# Patient Record
Sex: Male | Born: 1950 | Race: White | Hispanic: Yes | Marital: Married | Smoking: Former smoker
Health system: Southern US, Community
[De-identification: ages and names within clinical notes are randomized; demographics above are authoritative.]

## PROBLEM LIST (undated history)

## (undated) DIAGNOSIS — R002 Palpitations: Secondary | ICD-10-CM

## (undated) DIAGNOSIS — E785 Hyperlipidemia, unspecified: Secondary | ICD-10-CM

## (undated) DIAGNOSIS — E781 Pure hyperglyceridemia: Secondary | ICD-10-CM

## (undated) DIAGNOSIS — H8109 Meniere's disease, unspecified ear: Secondary | ICD-10-CM

## (undated) DIAGNOSIS — E7800 Pure hypercholesterolemia, unspecified: Secondary | ICD-10-CM

## (undated) DIAGNOSIS — N189 Chronic kidney disease, unspecified: Secondary | ICD-10-CM

## (undated) DIAGNOSIS — I10 Essential (primary) hypertension: Secondary | ICD-10-CM

## (undated) DIAGNOSIS — I6523 Occlusion and stenosis of bilateral carotid arteries: Secondary | ICD-10-CM

## (undated) DIAGNOSIS — N320 Bladder-neck obstruction: Secondary | ICD-10-CM

## (undated) DIAGNOSIS — R7303 Prediabetes: Secondary | ICD-10-CM

## (undated) DIAGNOSIS — M129 Arthropathy, unspecified: Secondary | ICD-10-CM

## (undated) DIAGNOSIS — G4733 Obstructive sleep apnea (adult) (pediatric): Secondary | ICD-10-CM

## (undated) DIAGNOSIS — R1313 Dysphagia, pharyngeal phase: Secondary | ICD-10-CM

## (undated) DIAGNOSIS — C801 Malignant (primary) neoplasm, unspecified: Secondary | ICD-10-CM

## (undated) DIAGNOSIS — N529 Male erectile dysfunction, unspecified: Secondary | ICD-10-CM

## (undated) DIAGNOSIS — R42 Dizziness and giddiness: Secondary | ICD-10-CM

## (undated) DIAGNOSIS — K589 Irritable bowel syndrome without diarrhea: Secondary | ICD-10-CM

## (undated) DIAGNOSIS — R809 Proteinuria, unspecified: Secondary | ICD-10-CM

## (undated) DIAGNOSIS — K219 Gastro-esophageal reflux disease without esophagitis: Secondary | ICD-10-CM

## (undated) DIAGNOSIS — I209 Angina pectoris, unspecified: Secondary | ICD-10-CM

## (undated) DIAGNOSIS — E559 Vitamin D deficiency, unspecified: Secondary | ICD-10-CM

## (undated) DIAGNOSIS — R972 Elevated prostate specific antigen [PSA]: Secondary | ICD-10-CM

## (undated) DIAGNOSIS — R822 Biliuria: Secondary | ICD-10-CM

## (undated) DIAGNOSIS — J45909 Unspecified asthma, uncomplicated: Secondary | ICD-10-CM

## (undated) DIAGNOSIS — T8859XA Other complications of anesthesia, initial encounter: Secondary | ICD-10-CM

## (undated) DIAGNOSIS — K439 Ventral hernia without obstruction or gangrene: Secondary | ICD-10-CM

## (undated) DIAGNOSIS — R5383 Other fatigue: Secondary | ICD-10-CM

## (undated) DIAGNOSIS — R824 Acetonuria: Secondary | ICD-10-CM

## (undated) DIAGNOSIS — R3129 Other microscopic hematuria: Secondary | ICD-10-CM

## (undated) DIAGNOSIS — S82891A Other fracture of right lower leg, initial encounter for closed fracture: Secondary | ICD-10-CM

## (undated) DIAGNOSIS — R82998 Other abnormal findings in urine: Secondary | ICD-10-CM

## (undated) DIAGNOSIS — R300 Dysuria: Secondary | ICD-10-CM

## (undated) DIAGNOSIS — R399 Unspecified symptoms and signs involving the genitourinary system: Secondary | ICD-10-CM

## (undated) HISTORY — DX: Other fracture of right lower leg, initial encounter for closed fracture: S82.891A

## (undated) HISTORY — PX: FLEXIBLE BRONCHOSCOPY W/ UPPER ENDOSCOPY: SHX1648

## (undated) HISTORY — PX: PROSTATE BIOPSY: SHX241

## (undated) HISTORY — PX: COLONOSCOPY: SHX174

---

## 2005-04-09 ENCOUNTER — Ambulatory Visit: Payer: Self-pay | Admitting: Internal Medicine

## 2005-05-23 ENCOUNTER — Ambulatory Visit (HOSPITAL_COMMUNITY): Admission: RE | Admit: 2005-05-23 | Discharge: 2005-05-23 | Payer: Self-pay | Admitting: Internal Medicine

## 2007-06-03 ENCOUNTER — Emergency Department (HOSPITAL_COMMUNITY): Admission: EM | Admit: 2007-06-03 | Discharge: 2007-06-03 | Payer: Self-pay | Admitting: Emergency Medicine

## 2007-06-04 ENCOUNTER — Encounter (INDEPENDENT_AMBULATORY_CARE_PROVIDER_SITE_OTHER): Payer: Self-pay | Admitting: Internal Medicine

## 2007-06-04 ENCOUNTER — Ambulatory Visit: Payer: Self-pay | Admitting: Family Medicine

## 2007-06-04 LAB — CONVERTED CEMR LAB
AST: 25 units/L (ref 0–37)
Albumin: 4.4 g/dL (ref 3.5–5.2)
BUN: 16 mg/dL (ref 6–23)
Basophils Relative: 0 % (ref 0–1)
Calcium: 8.4 mg/dL (ref 8.4–10.5)
Chloride: 105 meq/L (ref 96–112)
Creatinine, Ser: 1.09 mg/dL (ref 0.40–1.50)
Glucose, Bld: 80 mg/dL (ref 70–99)
Hemoglobin: 15 g/dL (ref 13.0–17.0)
Lymphs Abs: 1.7 10*3/uL (ref 0.7–4.0)
MCHC: 33.5 g/dL (ref 30.0–36.0)
MCV: 94.7 fL (ref 78.0–100.0)
Monocytes Absolute: 0.4 10*3/uL (ref 0.1–1.0)
Monocytes Relative: 8 % (ref 3–12)
Neutro Abs: 3.1 10*3/uL (ref 1.7–7.7)
PSA: 0.73 ng/mL (ref 0.10–4.00)
RBC: 4.73 M/uL (ref 4.22–5.81)
WBC: 5.3 10*3/uL (ref 4.0–10.5)

## 2007-06-09 ENCOUNTER — Ambulatory Visit (HOSPITAL_COMMUNITY): Admission: RE | Admit: 2007-06-09 | Discharge: 2007-06-09 | Payer: Self-pay | Admitting: Internal Medicine

## 2007-06-25 ENCOUNTER — Ambulatory Visit: Payer: Self-pay | Admitting: Internal Medicine

## 2008-01-11 ENCOUNTER — Emergency Department (HOSPITAL_COMMUNITY): Admission: EM | Admit: 2008-01-11 | Discharge: 2008-01-12 | Payer: Self-pay | Admitting: Emergency Medicine

## 2008-04-27 ENCOUNTER — Ambulatory Visit: Payer: Self-pay | Admitting: Internal Medicine

## 2008-05-26 ENCOUNTER — Ambulatory Visit (HOSPITAL_COMMUNITY): Admission: RE | Admit: 2008-05-26 | Discharge: 2008-05-26 | Payer: Self-pay | Admitting: Internal Medicine

## 2008-08-16 ENCOUNTER — Emergency Department (HOSPITAL_COMMUNITY): Admission: EM | Admit: 2008-08-16 | Discharge: 2008-08-16 | Payer: Self-pay | Admitting: Emergency Medicine

## 2009-01-11 ENCOUNTER — Ambulatory Visit: Payer: Self-pay | Admitting: Internal Medicine

## 2009-05-09 ENCOUNTER — Ambulatory Visit: Payer: Self-pay | Admitting: Internal Medicine

## 2009-08-09 ENCOUNTER — Ambulatory Visit: Payer: Self-pay | Admitting: Internal Medicine

## 2009-08-09 ENCOUNTER — Encounter (INDEPENDENT_AMBULATORY_CARE_PROVIDER_SITE_OTHER): Payer: Self-pay | Admitting: Family Medicine

## 2009-08-09 LAB — CONVERTED CEMR LAB
ALT: 28 units/L (ref 0–53)
AST: 23 units/L (ref 0–37)
CO2: 21 meq/L (ref 19–32)
Calcium: 9.3 mg/dL (ref 8.4–10.5)
Chloride: 103 meq/L (ref 96–112)
Cholesterol: 319 mg/dL — ABNORMAL HIGH (ref 0–200)
Creatinine, Ser: 1.32 mg/dL (ref 0.40–1.50)
Hemoglobin: 14.7 g/dL (ref 13.0–17.0)
Lymphocytes Relative: 40 % (ref 12–46)
Lymphs Abs: 1.8 10*3/uL (ref 0.7–4.0)
MCHC: 32.3 g/dL (ref 30.0–36.0)
Monocytes Absolute: 0.4 10*3/uL (ref 0.1–1.0)
Monocytes Relative: 10 % (ref 3–12)
Neutro Abs: 2 10*3/uL (ref 1.7–7.7)
Neutrophils Relative %: 47 % (ref 43–77)
PSA: 0.86 ng/mL (ref 0.10–4.00)
Potassium: 4.4 meq/L (ref 3.5–5.3)
RBC: 4.79 M/uL (ref 4.22–5.81)
Sed Rate: 20 mm/hr — ABNORMAL HIGH (ref 0–16)
Sodium: 138 meq/L (ref 135–145)
Total CHOL/HDL Ratio: 8.6
Total Protein: 7.9 g/dL (ref 6.0–8.3)
WBC: 4.4 10*3/uL (ref 4.0–10.5)

## 2009-08-29 ENCOUNTER — Ambulatory Visit: Payer: Self-pay | Admitting: Internal Medicine

## 2009-10-23 ENCOUNTER — Ambulatory Visit: Payer: Self-pay | Admitting: Internal Medicine

## 2009-10-23 LAB — CONVERTED CEMR LAB
ALT: 64 units/L — ABNORMAL HIGH (ref 0–53)
Albumin: 4.2 g/dL (ref 3.5–5.2)
Alkaline Phosphatase: 90 units/L (ref 39–117)
CO2: 21 meq/L (ref 19–32)
Cholesterol: 219 mg/dL — ABNORMAL HIGH (ref 0–200)
Glucose, Bld: 98 mg/dL (ref 70–99)
Potassium: 4.1 meq/L (ref 3.5–5.3)
Sodium: 137 meq/L (ref 135–145)
Total Bilirubin: 0.8 mg/dL (ref 0.3–1.2)
Total Protein: 7.1 g/dL (ref 6.0–8.3)

## 2009-11-07 ENCOUNTER — Ambulatory Visit: Payer: Self-pay | Admitting: Internal Medicine

## 2009-12-06 ENCOUNTER — Ambulatory Visit: Payer: Self-pay | Admitting: Internal Medicine

## 2009-12-06 ENCOUNTER — Encounter (INDEPENDENT_AMBULATORY_CARE_PROVIDER_SITE_OTHER): Payer: Self-pay | Admitting: Family Medicine

## 2009-12-06 LAB — CONVERTED CEMR LAB
HDL: 35 mg/dL — ABNORMAL LOW (ref >39)
Total CHOL/HDL Ratio: 4.9
Triglycerides: 261 mg/dL — ABNORMAL HIGH (ref <150)

## 2009-12-19 ENCOUNTER — Ambulatory Visit: Payer: Self-pay | Admitting: Internal Medicine

## 2010-02-20 ENCOUNTER — Ambulatory Visit: Payer: Self-pay | Admitting: Internal Medicine

## 2010-08-06 ENCOUNTER — Encounter (INDEPENDENT_AMBULATORY_CARE_PROVIDER_SITE_OTHER): Payer: Self-pay | Admitting: *Deleted

## 2010-08-06 LAB — CONVERTED CEMR LAB
BUN: 17 mg/dL (ref 6–23)
CO2: 28 meq/L (ref 19–32)
Calcium: 10 mg/dL (ref 8.4–10.5)
Chloride: 104 meq/L (ref 96–112)
Cholesterol: 350 mg/dL — ABNORMAL HIGH (ref 0–200)
Creatinine, Ser: 1.31 mg/dL (ref 0.40–1.50)
Glucose, Bld: 112 mg/dL — ABNORMAL HIGH (ref 70–99)
HDL: 43 mg/dL (ref >39)
Total Bilirubin: 1.4 mg/dL — ABNORMAL HIGH (ref 0.3–1.2)
Total CHOL/HDL Ratio: 8.1
Triglycerides: 448 mg/dL — ABNORMAL HIGH (ref <150)

## 2010-10-20 ENCOUNTER — Inpatient Hospital Stay (INDEPENDENT_AMBULATORY_CARE_PROVIDER_SITE_OTHER)
Admission: RE | Admit: 2010-10-20 | Discharge: 2010-10-20 | Disposition: A | Discharge status: Home / Self Care | Payer: Self-pay | Source: Ambulatory Visit | Attending: Family Medicine | Admitting: Family Medicine

## 2010-10-20 ENCOUNTER — Ambulatory Visit (INDEPENDENT_AMBULATORY_CARE_PROVIDER_SITE_OTHER): Payer: Self-pay

## 2010-10-20 DIAGNOSIS — S5000XA Contusion of unspecified elbow, initial encounter: Secondary | ICD-10-CM

## 2011-11-25 ENCOUNTER — Encounter (HOSPITAL_COMMUNITY): Payer: Self-pay | Admitting: Cardiology

## 2011-11-25 ENCOUNTER — Emergency Department (INDEPENDENT_AMBULATORY_CARE_PROVIDER_SITE_OTHER)
Admission: EM | Admit: 2011-11-25 | Discharge: 2011-11-25 | Disposition: A | Discharge status: Home / Self Care | Payer: Worker's Compensation | Source: Home / Self Care

## 2011-11-25 DIAGNOSIS — S79929A Unspecified injury of unspecified thigh, initial encounter: Secondary | ICD-10-CM

## 2011-11-25 DIAGNOSIS — S79919A Unspecified injury of unspecified hip, initial encounter: Secondary | ICD-10-CM

## 2011-11-25 DIAGNOSIS — S76909A Unspecified injury of unspecified muscles, fascia and tendons at thigh level, unspecified thigh, initial encounter: Secondary | ICD-10-CM

## 2011-11-25 HISTORY — DX: Pure hypercholesterolemia, unspecified: E78.00

## 2011-11-25 MED ORDER — MELOXICAM 15 MG PO TABS: 15.0000 mg | ORAL_TABLET | Freq: Every day | ORAL | Status: AC

## 2011-11-25 NOTE — ED Provider Notes (Signed)
Timothy Rice is a 61 y.o. male who presents to Urgent Care today for right thigh pain for 1 week. Patient slipped one week ago and twisted his right knee. He has pain on the distal medial thigh since his injury. He has tried ice, rest, compression and elevation and Tylenol without much improvement.  He denies any locking catching or giving way.  He can walk.  He denies any history of knee injury.  He feels well otherwise.  He never made contact with the ground when he slipped.       PMH reviewed. Otherwise healthy  History  Substance Use Topics  . Smoking status: Never Smoker   . Smokeless tobacco: Not on file  . Alcohol Use: Yes     occas   ROS as above Medications reviewed. No current facility-administered medications for this encounter.   Current Outpatient Prescriptions  Medication Sig Dispense Refill  . PRESCRIPTION MEDICATION Cholesterol medication daily      . meloxicam (MOBIC) 15 MG tablet Take 1 tablet (15 mg total) by mouth daily.  14 tablet  0    Exam:  BP 124/81  Pulse 78  Temp(Src) 98.1 F (36.7 C) (Oral)  Resp 18  SpO2 100% Gen: Well NAD  right leg: Normal thigh musculature.  Tender to palpation along the distal vastus medialis or sartorious muscle.  Nontender over the joint line.  Normal motion without crepitation. No effusion.  Negative Lachman's curies in normal valgus and varus stress.   Strength is intact to knee extension.  However when asked to apply pressure against his medial foot with the knee pain at 90 he experiences pain at the distal insertion site of the medial quadriceps muscles.   No results found for this or any previous visit (from the past 24 hour(s)). No results found.  Assessment and Plan: 61 y.o. male with VMO or sartorious muscle strain.  Plan to use RICE therapy with NSAIDS. Provided prescription for meloxicam. If no improvement in one to 2 weeks follow up with orthopedics. Provided a handout in [place]. Patient expresses  understanding.      Rodolph Bong, MD 11/25/11 1816

## 2011-11-25 NOTE — ED Notes (Signed)
Pt injuried right knee one week ago by twisting type injury. Pt states pain is 5/10. Has not improved over the past week.

## 2011-11-25 NOTE — Discharge Instructions (Signed)
Thank you for coming in today. You have a muscle injury to the thigh.  Please take meloxicam daily for 2 weeks as needed for pain.  It should slowly get better.  Make an appointment with Dr. Charlann Boxer if you do not get better in a week.   Lesiones Deportivas (Athletic Injuries) Un tratamiento adecuado y temprano y la rehabilitacin llevan a una recuperacin ms rpida de la mayor parte de las lesiones deportivas. Podr reanudar su prctica deportiva completamente recuperado en menos tiempo si sigue estas reglas generales:   Reposo.Debe dejar reposar la lesin hasta que el movimiento no le provoque dolor. Donnamae Jude articulacin o un msculo lesionado Building control surveyor.   Eleve la lesin.Mantenga la zona lesionada elevada hasta que casi no haya hinchazn ni dolor. Lo mejor es elevar la zona lesionada por encima del nivel del corazn, si es posible.   Hielo.Aplique bolsas de hielo directamente en la lesin durante 3 a 4 das.   Compresin.Use un vendaje elstico en su lesin segn le hayan indicado. Esto ayuda a Building services engineer. Los vendajes elsticos no protegen articulaciones lesionadas. No permita que le transmitan un falso sentido de seguridad. Para esto es mejor utilizar tablillas ms rgidas y [place].   Rehabilitacin. Debera comenzar ni bien hayan cedido la hinchazn y el dolor de su lesin. Incluye ejercicios para mejorar el movimiento de la articulacin y la fuerza muscular. En ocasiones se utilizan refuerzos especiales, tablillas u ortesis para proteger de otra lesin cuando retoma su actividad deportiva.  Mantener una actitud positiva ayudar a que cure sus heridas ms rpida y completamente. Podr volver a Engineer, drilling fsicas que no le causen dolor ni que aumenten el riesgo de una nueva lesin, o segn lo que le hayan indicado. Esto lo ayudar a mantenerse en buen estado fsico. Tambin mejorar su actitud mental. No use demasiado su extremidad lesionada. Esto har que  sienta molestias y Administrator, sports.  Document Released: 06/03/2005 Document Revised: 05/23/2011 [facility] Patient Information 2012 [place], [place].

## 2011-11-27 NOTE — ED Provider Notes (Signed)
Medical screening examination/treatment/procedure(s) were performed by PGY-3 FM resident and as supervising physician I was immediately available for consultation/collaboration.   Nadeem Romanoski Moreno-Coll, MD   Tawona Filsinger Moreno-Coll, MD 11/27/11 0733 

## 2012-05-01 ENCOUNTER — Encounter: Payer: Self-pay | Admitting: Family Medicine

## 2012-05-01 ENCOUNTER — Ambulatory Visit: Payer: Self-pay | Admitting: Family Medicine

## 2012-05-01 VITALS — BP 116/76 | HR 72 | Temp 98.0°F | Resp 16 | Ht 64.0 in | Wt 151.6 lb

## 2012-05-01 DIAGNOSIS — E789 Disorder of lipoprotein metabolism, unspecified: Secondary | ICD-10-CM

## 2012-05-01 DIAGNOSIS — R002 Palpitations: Secondary | ICD-10-CM

## 2012-05-01 DIAGNOSIS — E785 Hyperlipidemia, unspecified: Secondary | ICD-10-CM

## 2012-05-01 LAB — COMPREHENSIVE METABOLIC PANEL
BUN: 16 mg/dL (ref 6–23)
CO2: 29 mEq/L (ref 19–32)
Creat: 1.08 mg/dL (ref 0.50–1.35)
Glucose, Bld: 97 mg/dL (ref 70–99)
Sodium: 139 mEq/L (ref 135–145)
Total Bilirubin: 1.5 mg/dL — ABNORMAL HIGH (ref 0.3–1.2)
Total Protein: 7.4 g/dL (ref 6.0–8.3)

## 2012-05-01 LAB — POCT CBC
Granulocyte percent: 59.7 %G (ref 37–80)
Hemoglobin: 15 g/dL (ref 14.1–18.1)
Lymph, poc: 1.9 (ref 0.6–3.4)
MCHC: 31.3 g/dL — AB (ref 31.8–35.4)
MPV: 9.4 fL (ref 0–99.8)
POC Granulocyte: 3.4 (ref 2–6.9)
POC MID %: 7.3 %M (ref 0–12)
RDW, POC: 12.6 %

## 2012-05-01 LAB — LIPID PANEL
Cholesterol: 199 mg/dL (ref 0–200)
HDL: 40 mg/dL (ref >39)
Triglycerides: 264 mg/dL — ABNORMAL HIGH (ref <150)
VLDL: 53 mg/dL — ABNORMAL HIGH (ref 0–40)

## 2012-05-01 MED ORDER — ROSUVASTATIN CALCIUM 40 MG PO TABS: 40.0000 mg | ORAL_TABLET | Freq: Every day | ORAL | Status: DC

## 2012-05-01 NOTE — Patient Instructions (Signed)
Your should receive a call or letter about your lab results within the next week to 10 days.  If your heart racing worsens or any chest pain or other new/worsening symptoms - go to emergency room.  Recheck in next few weeks to discuss your labs and heart racing - you may need to see a cardiologist.  You can schedule a physical in the next few months. We will request your old records from [facility].

## 2012-05-01 NOTE — Progress Notes (Signed)
Subjective:    Patient ID: [name], male    DOB: January 10, 1951, 61 y.o.   MRN: [id number]  HPI Powell Halbert is a 61 y.o. male Prior patient of [facility]. New patient here. On unknown name of chol med - takes each night.  Occasional missed dose - about 2 or 3 times per month.  No new side effects.  On this med for 3 -4 years.  No known personal or FH of CAD/early CAD. No new myalgias.  Works in Web designer - Insurance account manager.   Occasional faster heartbeat. No chest pain. No dyspnea. Lasts about 30 seconds at times.  Once a week or month. No dizziness with these symptoms.   Review of Systems  Respiratory: Negative for cough, chest tightness and shortness of breath.   Cardiovascular: Positive for palpitations (intermittent as above. ). Negative for chest pain and leg swelling.  Musculoskeletal: Negative for myalgias.  Neurological: Positive for light-headedness (only a few times - with standing up after head leaning down.  no syncope, no dizziness with  palitations. ). Negative for dizziness, seizures, syncope and headaches.   As above.     Objective:   Physical Exam  Vitals reviewed. Constitutional: He is oriented to person, place, and time. He appears well-developed and well-nourished.  HENT:  Head: Normocephalic and atraumatic.  Eyes: EOM are normal. Pupils are equal, round, and reactive to light.  Neck: No JVD present. Carotid bruit is not present. No thyromegaly (no nodules palpated. ) present.  Cardiovascular: Normal rate, regular rhythm and normal heart sounds.   No murmur heard. Pulmonary/Chest: Effort normal and breath sounds normal. He has no rales.  Musculoskeletal: He exhibits no edema.  Neurological: He is alert and oriented to person, place, and time.  Skin: Skin is warm and dry.  Psychiatric: He has a normal mood and affect. His behavior is normal.   EKG: sr, no acute findings.   Results for orders placed in visit on 05/01/12  POCT CBC      Component Value  Range   WBC 5.7  4.6 - 10.2 K/uL   Lymph, poc 1.9  0.6 - 3.4   POC LYMPH PERCENT 33.0  10 - 50 %L   MID (cbc) 0.4  0 - 0.9   POC MID % 7.3  0 - 12 %M   POC Granulocyte 3.4  2 - 6.9   Granulocyte percent 59.7  37 - 80 %G   RBC 4.97  4.69 - 6.13 M/uL   Hemoglobin 15.0  14.1 - 18.1 g/dL   HCT, POC 14.7  82.9 - 53.7 %   MCV 96.6  80 - 97 fL   MCH, POC 30.2  27 - 31.2 pg   MCHC 31.3 (*) 31.8 - 35.4 g/dL   RDW, POC 56.2     Platelet Count, POC 227  142 - 424 K/uL   MPV 9.4  0 - 99.8 fL       Assessment & Plan:  Saafir Abdullah is a 61 y.o. male 1. Hyperlipidemia  Comprehensive metabolic panel, Lipid panel  2. Palpitations  TSH, POCT CBC, EKG 12-Lead   Hyperlipidemia - check labs above. By history on 40mg  Crestor each day.  Will refill this - check lipids. Follow up for physical in next few months - encouraged to schedule.   Palpitations - intermittent check labs above, but follow up to discuss next few weeks as may need cardiology eval. Rtc/er/911 precautions reviewed.   Encouraged to rtc if any worsening of symptoms  and to recheck episodic dizziness - not associated with palpitations at present, but if his occurs - to ER/911. Understanding expressed.  Spanish and english spoken.

## 2014-06-17 DIAGNOSIS — S82891A Other fracture of right lower leg, initial encounter for closed fracture: Secondary | ICD-10-CM

## 2014-06-17 HISTORY — DX: Other fracture of right lower leg, initial encounter for closed fracture: S82.891A

## 2014-12-16 ENCOUNTER — Ambulatory Visit (INDEPENDENT_AMBULATORY_CARE_PROVIDER_SITE_OTHER): Payer: 59 | Admitting: Emergency Medicine

## 2014-12-16 VITALS — BP 118/72 | HR 96 | Temp 97.9°F | Resp 18 | Ht 63.0 in | Wt 149.0 lb

## 2014-12-16 DIAGNOSIS — E785 Hyperlipidemia, unspecified: Secondary | ICD-10-CM | POA: Diagnosis not present

## 2014-12-16 DIAGNOSIS — Z0000 Encounter for general adult medical examination without abnormal findings: Secondary | ICD-10-CM

## 2014-12-16 DIAGNOSIS — R197 Diarrhea, unspecified: Secondary | ICD-10-CM

## 2014-12-16 LAB — LIPID PANEL
Cholesterol: 429 mg/dL — ABNORMAL HIGH (ref 0–200)
HDL: 39 mg/dL — ABNORMAL LOW (ref >=40)
Total CHOL/HDL Ratio: 11 Ratio
Triglycerides: 626 mg/dL — ABNORMAL HIGH (ref <150)

## 2014-12-16 LAB — COMPLETE METABOLIC PANEL WITH GFR
ALK PHOS: 80 U/L (ref 39–117)
ALT: 17 U/L (ref 0–53)
AST: 19 U/L (ref 0–37)
Albumin: 3.8 g/dL (ref 3.5–5.2)
BUN: 9 mg/dL (ref 6–23)
CALCIUM: 8.6 mg/dL (ref 8.4–10.5)
CHLORIDE: 104 meq/L (ref 96–112)
CO2: 26 meq/L (ref 19–32)
Creat: 1.2 mg/dL (ref 0.50–1.35)
GFR, EST AFRICAN AMERICAN: 74 mL/min
GFR, EST NON AFRICAN AMERICAN: 64 mL/min
GLUCOSE: 92 mg/dL (ref 70–99)
Potassium: 4.2 mEq/L (ref 3.5–5.3)
Sodium: 139 mEq/L (ref 135–145)
Total Bilirubin: 1.2 mg/dL (ref 0.2–1.2)
Total Protein: 6.5 g/dL (ref 6.0–8.3)

## 2014-12-16 LAB — POCT CBC
GRANULOCYTE PERCENT: 57.2 % (ref 37–80)
HEMATOCRIT: 41.4 % — AB (ref 43.5–53.7)
Hemoglobin: 13.7 g/dL — AB (ref 14.1–18.1)
Lymph, poc: 1.4 (ref 0.6–3.4)
MCH, POC: 29.6 pg (ref 27–31.2)
MCHC: 33.1 g/dL (ref 31.8–35.4)
MCV: 89.3 fL (ref 80–97)
MID (CBC): 0.4 (ref 0–0.9)
MPV: 7.4 fL (ref 0–99.8)
POC GRANULOCYTE: 2.4 (ref 2–6.9)
POC LYMPH %: 33.9 % (ref 10–50)
POC MID %: 8.9 % (ref 0–12)
Platelet Count, POC: 192 10*3/uL (ref 142–424)
RBC: 4.64 M/uL — AB (ref 4.69–6.13)
RDW, POC: 13.5 %
WBC: 4.2 10*3/uL — AB (ref 4.6–10.2)

## 2014-12-16 LAB — POCT URINALYSIS DIPSTICK
Bilirubin, UA: NEGATIVE
Glucose, UA: NEGATIVE
KETONES UA: NEGATIVE
Leukocytes, UA: NEGATIVE
Nitrite, UA: NEGATIVE
Protein, UA: NEGATIVE
SPEC GRAV UA: 1.025
Urobilinogen, UA: 0.2
pH, UA: 5.5

## 2014-12-16 LAB — POCT UA - MICROSCOPIC ONLY
BACTERIA, U MICROSCOPIC: NEGATIVE
Casts, Ur, LPF, POC: NEGATIVE
Crystals, Ur, HPF, POC: NEGATIVE
MUCUS UA: NEGATIVE
WBC, Ur, HPF, POC: NEGATIVE
Yeast, UA: NEGATIVE

## 2014-12-16 LAB — IFOBT (OCCULT BLOOD): IFOBT: NEGATIVE

## 2014-12-16 MED ORDER — ROSUVASTATIN CALCIUM 40 MG PO TABS: 40.0000 mg | ORAL_TABLET | Freq: Every day | ORAL | Status: DC

## 2014-12-16 MED ORDER — METRONIDAZOLE 500 MG PO TABS: 500.0000 mg | ORAL_TABLET | Freq: Three times a day (TID) | ORAL | Status: DC

## 2014-12-16 NOTE — Patient Instructions (Signed)

## 2014-12-16 NOTE — Progress Notes (Addendum)
Subjective:  This chart was scribed for Arlyss Queen, MD by Leandra Kern, Medical Scribe. This patient was seen in Room 1 and the patient's care was started at 10:33 AM.   Patient ID: Timothy Rice, male    DOB: 10-26-1950, 64 y.o.   MRN: [id number]  HPI HPI Comments: Timothy Rice is a 64 y.o. male who [facility] for diarrhea, gradual onset 10 days ago.  He states that he has just returned from a trip from [place] on 40/98/1191. Pt reports that the symptoms have started when he was overseas, they resolved, and then returned with the current onset. He reports that he has about 2-3 episodes a day. He denies fevers, vomiting, or chest pain.  Pt notes no past surgical history. He had gotten a colonoscopy about a year ago with normal results [facility].   Review of Systems  Constitutional: Negative for fever.  Cardiovascular: Negative for chest pain.  Gastrointestinal: Positive for diarrhea. Negative for vomiting.      Objective:   Physical Exam  Constitutional: He is oriented to person, place, and time. He appears well-developed and well-nourished. No distress.  HENT:  Head: Normocephalic and atraumatic.  Eyes: EOM are normal. Pupils are equal, round, and reactive to light.  Neck: Neck supple.  Cardiovascular: Normal rate.   Pulmonary/Chest: Effort normal.  Neurological: He is alert and oriented to person, place, and time. No cranial nerve deficit.  Skin: Skin is warm and dry.  Psychiatric: He has a normal mood and affect. His behavior is normal.  Nursing note and vitals reviewed.  Results for orders placed or performed in visit on 12/16/14  POCT CBC  Result Value Ref Range   WBC 4.2 (A) 4.6 - 10.2 K/uL   Lymph, poc 1.4 0.6 - 3.4   POC LYMPH PERCENT 33.9 10 - 50 %L   MID (cbc) 0.4 0 - 0.9   POC MID % 8.9 0 - 12 %M   POC Granulocyte 2.4 2 - 6.9   Granulocyte percent 57.2 37 - 80 %G   RBC 4.64 (A) 4.69 - 6.13 M/uL    Hemoglobin 13.7 (A) 14.1 - 18.1 g/dL   HCT, POC 41.4 (A) 43.5 - 53.7 %   MCV 89.3 80 - 97 fL   MCH, POC 29.6 27 - 31.2 pg   MCHC 33.1 31.8 - 35.4 g/dL   RDW, POC 13.5 %   Platelet Count, POC 192 142 - 424 K/uL   MPV 7.4 0 - 99.8 fL  IFOBT POC (occult bld, rslt in office)  Result Value Ref Range   IFOBT Negative   POCT UA - Microscopic Only  Result Value Ref Range   WBC, Ur, HPF, POC neg    RBC, urine, microscopic 0-1    Bacteria, U Microscopic neg    Mucus, UA neg    Epithelial cells, urine per micros 0-1    Crystals, Ur, HPF, POC neg    Casts, Ur, LPF, POC neg    Yeast, UA neg   POCT urinalysis dipstick  Result Value Ref Range   Color, UA yellow    Clarity, UA clear    Glucose, UA neg    Bilirubin, UA neg    Ketones, UA neg    Spec Grav, UA 1.025    Blood, UA trace-lysed    pH, UA 5.5    Protein, UA neg    Urobilinogen, UA 0.2    Nitrite,  UA neg    Leukocytes, UA Negative Negative      Assessment & Plan:  I did repeat his cholesterol. Routine blood work was done. We'll go ahead and collect a stool to see what type of pathogen he could've picked up in the [place] Will treat with Flagyl 500 3 times a day once he obtains his culture.I personally performed the services described in this documentation, which was scribed in my presence. The recorded information has been reviewed and is accurate.  Nena Jordan, MD

## 2014-12-17 ENCOUNTER — Other Ambulatory Visit: Payer: Self-pay | Admitting: Emergency Medicine

## 2014-12-17 LAB — PSA: PSA: 2.03 ng/mL (ref <=4.00)

## 2014-12-26 LAB — OTHER SOLSTAS TEST

## 2014-12-27 ENCOUNTER — Telehealth: Payer: Self-pay

## 2014-12-27 NOTE — Telephone Encounter (Signed)
Dr. Everlene Farrier, Randell Loop wanted me to make sure you got the below info of this pt's Gastrointestinal Pathogen Panel  "SAMPLE INTERNAL CONTROL FAILURE(S) INDICATED THE POTENTIAL PRESENCE  OF SUBSTANCES THAT ARE INHIBITORY TO PCR. PLEASE SUBMIT A NEW  SPECIMEN."   She said that they are not sure what, but somehow the sample was contaminated. She said it could of been a medicine.

## 2014-12-27 NOTE — Telephone Encounter (Signed)
Call patient and let him know the specimen was contaminated. He needs to bring another specimen by if he is still having diarrhea

## 2014-12-28 NOTE — Telephone Encounter (Signed)
Patient reports that he came to clinic today and was told he no longer had to provide a stool sample. Patient denies ongoing diarrhea, finished a course of Flagyl and is asymptomatic. Dr. Everlene Farrier confirms that he no longer has to provide a stool sample if his diarrhea has resolved.

## 2014-12-28 NOTE — Telephone Encounter (Signed)
Spoke with pt, but he cannot understand me. Ronalee Belts can you call?

## 2015-04-18 ENCOUNTER — Ambulatory Visit (INDEPENDENT_AMBULATORY_CARE_PROVIDER_SITE_OTHER): Payer: 59 | Admitting: Family Medicine

## 2015-04-18 VITALS — BP 122/78 | HR 81 | Temp 98.2°F | Resp 16 | Ht 63.0 in | Wt 148.6 lb

## 2015-04-18 DIAGNOSIS — R208 Other disturbances of skin sensation: Secondary | ICD-10-CM

## 2015-04-18 DIAGNOSIS — D2221 Melanocytic nevi of right ear and external auricular canal: Secondary | ICD-10-CM | POA: Diagnosis not present

## 2015-04-18 DIAGNOSIS — M542 Cervicalgia: Secondary | ICD-10-CM

## 2015-04-18 NOTE — Progress Notes (Signed)
Subjective:  This chart was scribed for Timothy Ray, MD by Moises Blood, Medical Scribe. This patient was seen in room 12 and the patient's care was started 11:02 AM.   Patient ID: Timothy Rice, male    DOB: 23-Aug-1950, 64 y.o.   MRN: [id number]  HPI Timothy Rice is a 64 y.o. male   Right Ear Pain Pt has right ear pain that comes and goes around the ear that starts at the ear and runs down to his shoulder that started a month ago. Sometimes, he hears some noise in the area too. 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 He denies dizziness, tinnitus, and swelling in the area.   He had right ear conchal bowl biopsy done 2-3 years ago, but was not told cancer or remarkable. It was diagnosed as a irritated seborrheic keratosis. He did notice that it left a dark bump in his ear.   Right Heel He also notes having some numbness at base of right heel with tingling. Sometimes he doesn't have feelings in the area. This has been going on for a year now. He states having slight decreased sensation. He denies any prior injury.   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 He is from [place]   There are no active problems to display for this patient.  Past Medical History  Diagnosis Date  . Hypercholesteremia    History reviewed. No pertinent past surgical history. No Known Allergies Prior to Admission medications   Medication Sig Start Date End Date Taking? Authorizing Provider  rosuvastatin (CRESTOR) 40 MG tablet Take 1 tablet (40 mg total) by mouth daily. 12/16/14  Yes Darlyne Russian, MD   Social History   Social History  . Marital Status: Married    Spouse Name: N/A  . Number of Children: N/A  . Years of Education: N/A   Occupational History  . Not on file.   Social History Main Topics  . Smoking status: Never Smoker   . Smokeless tobacco: Not on file  . Alcohol Use: Yes     Comment: occas  . Drug Use: No  .  Sexual Activity: Not on file   Other Topics Concern  . Not on file   Social History Narrative    Review of Systems  HENT: Positive for ear pain. Negative for ear discharge, hearing loss and tinnitus.   Skin: Negative for rash and wound.  Neurological: Positive for numbness (right heel). Negative for dizziness.       Objective:   Physical Exam  Constitutional: He is oriented to person, place, and time. He appears well-developed and well-nourished. No distress.  HENT:  Head: Normocephalic and atraumatic.  Right Ear: Tympanic membrane and ear canal normal.  Left Ear: Tympanic membrane and ear canal normal.  conchal bowl 7x7mm, dark black in appearance in periphery with a slight elevated pink center, remainder of ear looks normal.   Eyes: EOM are normal. Pupils are equal, round, and reactive to light.  Neck: Neck supple. Normal carotid pulses present. Carotid bruit is not present.  Carotid not enlarged or tender  Cardiovascular: Normal rate, regular rhythm and normal heart sounds.  Exam reveals no gallop and no friction rub.   No murmur heard. Pulmonary/Chest: Effort normal and breath sounds normal. No stridor. No respiratory distress. He has no wheezes.  Musculoskeletal: Normal range of motion.  slight tenderness of the trapezius, no bony tenderness, slight decreased ROM of cervical spine, right  shoulder pain repropduced extension of neck  Neurological: He is alert and oriented to person, place, and time.  Skin: Skin is warm and dry.  Negative heel squeeze, sensation throughout all foot. Decreased sensation on his skin itself  Psychiatric: He has a normal mood and affect. His behavior is normal.  Nursing note and vitals reviewed.   Filed Vitals:   04/18/15 1039  BP: 122/78  Pulse: 81  Temp: 98.2 F (36.8 C)  TempSrc: Oral  Resp: 16  Height: 5\' 3"  (1.6 m)  Weight: 148 lb 9.6 oz (67.405 kg)  SpO2: 98%       Assessment & Plan:   Virginia Francisco is a 64 y.o.  male Nevus of ear, right - Plan: Ambulatory referral to ENT  -prior bx was seb keratosis, but surrounding dark appearance concerning for atypical nevus.  DDX melanoma.  Based on location and need for biopsy likely - will have evaluated by ENT. Hx of meniere's, but asx currently.   Dysesthesia of R heel.   - possible compressive force neuropathy form type of work and possible fat pad breakdown. No distal neuropathy sx's. Trial of viscoelastic or silicone heel cup and recheck in 4-6 weeks. Consider podiatry if persists.   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 Intermittent sx's.  Trial of heat, ROM/stretches, tylenol otc for now and recheck in 4-6 weeks. Sooner if worse.   No orders of the defined types were placed in this encounter.   Patient Instructions  Voy a referir specialista para problema de oreja.  Tylenol si necesario por dolor in cuello o ombro. "heel cushion" si necesario para simptomas en talon. regrese en proximo 4-6 semanas hablar mas de estos condiciones.      By signing my name below, I, Moises Blood, attest that this documentation has been prepared under the direction and in the presence of Timothy Ray, MD. Electronically Signed: Moises Blood, [place]. 04/18/2015 , 11:02 AM .  I personally performed the services described in this documentation, which was scribed in my presence. The recorded information has been reviewed and considered, and addended by me as needed.

## 2015-04-18 NOTE — Patient Instructions (Signed)
Voy a referir specialista para problema de oreja.  Tylenol si necesario por dolor in cuello o ombro. "heel cushion" si necesario para simptomas en talon. regrese en proximo 4-6 semanas hablar mas de estos condiciones.

## 2015-05-26 ENCOUNTER — Emergency Department (HOSPITAL_COMMUNITY): Payer: 59

## 2015-05-26 ENCOUNTER — Emergency Department (HOSPITAL_COMMUNITY)
Admission: EM | Admit: 2015-05-26 | Discharge: 2015-05-26 | Disposition: A | Discharge status: Home / Self Care | Payer: 59 | Attending: Emergency Medicine | Admitting: Emergency Medicine

## 2015-05-26 ENCOUNTER — Encounter (HOSPITAL_COMMUNITY): Payer: Self-pay | Admitting: Emergency Medicine

## 2015-05-26 DIAGNOSIS — S82234A Nondisplaced oblique fracture of shaft of right tibia, initial encounter for closed fracture: Secondary | ICD-10-CM | POA: Diagnosis not present

## 2015-05-26 DIAGNOSIS — Z23 Encounter for immunization: Secondary | ICD-10-CM | POA: Insufficient documentation

## 2015-05-26 DIAGNOSIS — E7800 Pure hypercholesterolemia, unspecified: Secondary | ICD-10-CM | POA: Insufficient documentation

## 2015-05-26 DIAGNOSIS — W11XXXA Fall on and from ladder, initial encounter: Secondary | ICD-10-CM | POA: Diagnosis not present

## 2015-05-26 DIAGNOSIS — Y998 Other external cause status: Secondary | ICD-10-CM | POA: Diagnosis not present

## 2015-05-26 DIAGNOSIS — S82201A Unspecified fracture of shaft of right tibia, initial encounter for closed fracture: Secondary | ICD-10-CM

## 2015-05-26 DIAGNOSIS — Y9389 Activity, other specified: Secondary | ICD-10-CM | POA: Diagnosis not present

## 2015-05-26 DIAGNOSIS — Z79899 Other long term (current) drug therapy: Secondary | ICD-10-CM | POA: Insufficient documentation

## 2015-05-26 DIAGNOSIS — Y9289 Other specified places as the place of occurrence of the external cause: Secondary | ICD-10-CM | POA: Insufficient documentation

## 2015-05-26 DIAGNOSIS — S99911A Unspecified injury of right ankle, initial encounter: Secondary | ICD-10-CM | POA: Diagnosis present

## 2015-05-26 MED ORDER — TETANUS-DIPHTH-ACELL PERTUSSIS 5-2.5-18.5 LF-MCG/0.5 IM SUSP
0.5000 mL | Freq: Once | INTRAMUSCULAR | Status: AC
  Administered 2015-05-26: 0.5 mL via INTRAMUSCULAR
  Filled 2015-05-26: qty 0.5

## 2015-05-26 MED ORDER — OXYCODONE-ACETAMINOPHEN 5-325 MG PO TABS: 1.0000 | ORAL_TABLET | Freq: Four times a day (QID) | ORAL | Status: DC | PRN

## 2015-05-26 MED ORDER — OXYCODONE-ACETAMINOPHEN 5-325 MG PO TABS
1.0000 | ORAL_TABLET | Freq: Once | ORAL | Status: AC
  Administered 2015-05-26: 1 via ORAL
  Filled 2015-05-26: qty 1

## 2015-05-26 MED ORDER — IBUPROFEN 400 MG PO TABS
800.0000 mg | ORAL_TABLET | Freq: Once | ORAL | Status: AC
  Administered 2015-05-26: 800 mg via ORAL
  Filled 2015-05-26: qty 2

## 2015-05-26 NOTE — Discharge Instructions (Signed)
Please call and follow up with orthopedist next week for further care of your broken leg.  Do not bear any weight while walking.  Keep leg elevated  Fractura de la tibia, adultos (Tibial Fracture, Adult) La fractura de la tibia es la rotura del hueso ms grande que est en la parte inferior de la pierna (tibia), tambin conocido como "canilla". CAUSAS   Lesiones de bajo impacto, como una cada en el nivel del suelo.  Lesiones de alto impacto, como colisiones en la prctica de deportes de alta velocidad o por accidentes automovilsticos. FACTORES DE RIESGO  Actividades de salto.  Esfuerzo repetitivo, como carreras de larga distancia.  Practicar deportes.  Osteoporosis.  Edad avanzada. [place].  Hinchazn.  Imposibilidad de Artist en la pierna lesionada.  Deformidades seas en el lugar de la lesin.  Hematomas. DIAGNSTICO  Por lo general, la fractura de la tibia puede diagnosticarse con radiografas. TRATAMIENTO  Con frecuencia, la fractura de la tibia se trata con una simple inmovilizacin. Se utilizar un yeso o una frula en la pierna para mantenerla inmovilizada [place] se [place]. Si la lesin hizo que se desplazaran partes del hueso, el mdico puede reacomodarlas antes de colocarle el yeso o la frula. El mdico dejar el yeso o la frula colocados hasta que considere que el hueso se ha consolidado lo suficiente. Luego puede comenzar con ejercicios de amplitud de movimiento para recuperar la movilidad de la rodilla. Cuando hay lesiones graves, a veces se requiere [place] para introducir placas o tornillos en el rea lesionada. INSTRUCCIONES PARA EL CUIDADO EN EL HOGAR   Si le colocaron un yeso o una frula de [place] de vidrio:  No trate de rascarse la piel por debajo del yeso con objetos filosos o puntiagudos.  [place] piel de alrededor del yeso. Puede colocarse una locin en las zonas rojas o doloridas.  Mantenga el yeso  seco y limpio.  Si tiene una frula de yeso:  sela como se lo indicaron.  Afloje el elstico que rodea la frula si los dedos se entumecen, siente hormigueo, se enfran o se vuelven de color azul.  No ejerza presin en ninguna parte del yeso o de la frula hasta que se haya endurecido.  Use una bolsa plstica para proteger el yeso o la frula cuando se bae. No los sumerja en el agua.  Utilice las [employer] del modo en que se lo indicaron.  Tome los medicamentos solamente como se lo haya indicado el mdico.  Concurra a todas las visitas de control como se lo haya indicado el mdico. Esto es importante. SOLICITE ATENCIN MDICA SI:  El Nutritional therapist de mejorar, o si no es controlable con los medicamentos.  Aumenta la hinchazn o el enrojecimiento en el pie.  Comienza a perder la sensibilidad en el pie o en los dedos. SOLICITE ATENCIN MDICA DE INMEDIATO SI:   Siente que ese pie o los dedos de ese pie estn fros o nota que se tornan de [employer].  Siente dolor intenso en la pierna lesionada, especialmente si aumenta el dolor con el movimiento de los dedos. ASEGRESE DE QUE:  Comprende estas instrucciones.  Controlar su afeccin.  Recibir ayuda de inmediato si no mejora o si empeora.   Esta informacin no tiene Marine scientist el consejo del mdico. Asegrese de hacerle al mdico cualquier pregunta que tenga.   Document Released: 03/13/2005 Document Revised: 10/18/2014 Elsevier Interactive Patient Education 2016 Elsevier  Inc. ° °

## 2015-05-26 NOTE — Progress Notes (Signed)
Orthopedic Tech Progress Note Patient Details:  Timothy Rice Nov 05, 1950 [id number]  Ortho Devices Type of Ortho Device: Ace wrap, Post (short leg) splint, Crutches Ortho Device/Splint Interventions: Application   [name] 05/26/2015, 1:05 PM

## 2015-05-26 NOTE — ED Provider Notes (Signed)
CSN: [id number]     Arrival date & time 05/26/15  1119 History  By signing my name below, I, Meriel Pica, attest that this documentation has been prepared under the direction and in the presence of Domenic Moras, PA-C.  Electronically Signed: Meriel Pica, ED Scribe. 05/26/2015. 11:52 AM.   Chief Complaint  Patient presents with  . Leg Injury   The history is provided by the patient. No language interpreter was used.   HPI Comments: Timothy Rice is a 64 y.o. male, with no pertinent PMhx, who presents to the Emergency Department complaining of sudden onset, constant, sharp, severe right medial and lateral ankle pain s/p fall from approximately 6 feet that occurred PTA. He states he slipped while coming down off a step ladder. There is mild edema and ecchymosis noted to right, medial ankle. Pt was unable to bear weight on the right extremity after the fall. There is a small abrasion noted to anterior, distal tib/fib. Tetanus not UTD. He denies head injury, LOC, numbness or weakness in RLE, or previous injury to right ankle.   Past Medical History  Diagnosis Date  . Hypercholesteremia    History reviewed. No pertinent past surgical history. No family history on file. Social History  Substance Use Topics  . Smoking status: Never Smoker   . Smokeless tobacco: None  . Alcohol Use: Yes     Comment: occas    Review of Systems  Musculoskeletal: Positive for joint swelling ( right ankle) and arthralgias ( right ankle).  Skin: Positive for color change ( ecchymosis right ankle ) and wound ( abrasion to right anterior tib/fib ).  Neurological: Negative for syncope, weakness and numbness.   Allergies  Review of patient's allergies indicates no known allergies.  Home Medications   Prior to Admission medications   Medication Sig Start Date End Date Taking? Authorizing Provider  rosuvastatin (CRESTOR) 40 MG tablet Take 1 tablet (40 mg total) by mouth daily. 12/16/14   Darlyne Russian, MD    BP 134/82 mmHg  Pulse 77  Temp(Src) 97.5 F (36.4 C) (Oral)  Resp 16  SpO2 100% Physical Exam  Constitutional: He is oriented to person, place, and time. He appears well-developed and well-nourished. No distress.  HENT:  Head: Normocephalic.  Eyes: Conjunctivae are normal.  Neck: Normal range of motion. Neck supple.  Cardiovascular: Normal rate.   Pulmonary/Chest: Effort normal. No respiratory distress.  Musculoskeletal: Normal range of motion. He exhibits edema and tenderness.       Right knee: Normal.  Right ankle; tenderness to medial malleolus, no crepitus, mild edema noted; decreased dorsiflexion, plantar flexion, ankle eversion and inversion, small abrasion noted to distal, anterior tib/fib, no crepitus, right knee is non-tender with FROM, DP and TP intact, no pain at 5th metatarsal; strength and sensation intact; NVI.   Neurological: He is alert and oriented to person, place, and time. Coordination normal.  Skin: Skin is warm.  Psychiatric: He has a normal mood and affect. His behavior is normal.  Nursing note and vitals reviewed.   ED Course  Procedures DIAGNOSTIC STUDIES: Oxygen Saturation is 100% on RA, normal by my interpretation.    COORDINATION OF CARE: 11:42 AM Discussed treatment plan which includes to order tetanus update and Xray of right ankle with pt. Pt acknowledges and agrees to plan.  12:22 PM Discussed with pt tibial fracture as viewed and read on Xray.  Pt may need R ankle CT scan.  1:30 PM CT of right ankle obtained. Discussed results  with pt. Will order pain medication.   1:40 PM Posterior splint and crutches provided.  Pt will need to be non weight bearing. CT result discussed.  Pt will f/u with orthopedist closely next week for further care.   Imaging Review Dg Ankle Complete Right  05/26/2015  CLINICAL DATA:  64 year old male with history of trauma from a fall off a ladder complaining of right ankle pain. EXAM: RIGHT ANKLE - COMPLETE 3+ VIEW  COMPARISON:  No priors. FINDINGS: Three views of the right ankle demonstrate a nondisplaced oblique fracture of the distal metaphyseal region of the tibia. Whether or not this extends to the articular surface is uncertain, however, on the oblique projection, possible intra-articular extension is noted. The distal fibula appears intact. Ankle mortise appears preserved. Soft tissue swelling is noted around the ankle joint. IMPRESSION: 1. Acute nondisplaced oblique fracture through the distal right tibial metaphysis. Potential intra-articular extension is noted, but is uncertain. Further evaluation with right ankle CT scan could be performed to determine whether or not there is intra-articular extension if clinically appropriate. Electronically Signed   By: Vinnie Langton M.D.   On: 05/26/2015 12:25   Ct Ankle Right Wo Contrast  05/26/2015  CLINICAL DATA:  [place] from a ladder. Medial and posterior distal tibial pain. EXAM: CT OF THE RIGHT ANKLE WITHOUT CONTRAST TECHNIQUE: Multidetector CT imaging of the right ankle was performed according to the standard protocol. Multiplanar CT image reconstructions were also generated. COMPARISON:  None. FINDINGS: There is a nondisplaced fracture of the medial malleolus. There is a comminuted fracture of the lateral aspect of the distal tibial metaphysis extending to the articular surface of the anterior lateral tibial plafond without significant displacement. There is a posterior malleolar fracture with 2 mm of posterior displacement of the fracture fragment also involving the articular surface. There are tiny os ossific fragments adjacent to the lateral talus just distal to the lateral malleolus likely reflecting sequela of avulsive injury. The ankle mortise is intact. There is no distal fibular fracture. The subtalar joints are normal. The sinus tarsi is normal. 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 There is mild soft tissue edema around the ankle. There is  no fluid collection or hematoma. The peroneal tendons are intact. The tibialis posterior and flexor digitorum longus tendons course along the fracture cleft of the posterior malleolus without entrapment. The extensor compartment tendons are grossly intact. The Achilles tendon is grossly intact. There is peripheral vascular atherosclerotic disease. There is no soft tissue emphysema. IMPRESSION: 1. Nondisplaced fracture of the medial malleolus. Comminuted fracture of the lateral aspect of the distal tibial metaphysis extending to the articular surface of the anterior lateral tibial plafond without significant displacement. Posterior malleolar fracture with 2 mm of posterior displacement of the fracture fragment also involving the articular surface. Electronically Signed   By: Kathreen Devoid   On: 05/26/2015 13:27   I have personally reviewed and evaluated these images as part of my medical decision-making.   MDM   Patient X-Ray positive for acute nondisplaced oblique fracture through the distal right tibial metaphysis. Pt given referral to orthopedist. Patient given posterior splint and crutches while in ED, conservative therapy recommended and discussed. Patient will be discharged home & is agreeable with above plan. Returns precautions discussed. Pt appears safe for discharge.  Final diagnoses:  Closed fracture of right tibia, initial encounter    BP 134/82 mmHg  Pulse 77  Temp(Src) 97.5 F (36.4 C) (Oral)  Resp 16  SpO2 100%  I personally performed the services described in this documentation, which was scribed in my presence. The recorded information has been reviewed and is accurate.      Domenic Moras, PA-C 05/26/15 1341  Harvel Quale, MD 05/27/15 2115

## 2015-05-26 NOTE — ED Notes (Signed)
States he fell 6 feet off of a ladder.c/o right lower leg and ankle pain. Abrasion to RLL.

## 2015-05-26 NOTE — ED Notes (Signed)
Called ortho.  They are on the way to place splint.

## 2015-05-29 ENCOUNTER — Encounter: Payer: Self-pay | Admitting: Urgent Care

## 2015-05-29 ENCOUNTER — Ambulatory Visit (INDEPENDENT_AMBULATORY_CARE_PROVIDER_SITE_OTHER): Payer: 59 | Admitting: Urgent Care

## 2015-05-29 VITALS — BP 110/66 | HR 90 | Temp 98.3°F | Resp 16

## 2015-05-29 DIAGNOSIS — S82891A Other fracture of right lower leg, initial encounter for closed fracture: Secondary | ICD-10-CM | POA: Diagnosis not present

## 2015-05-29 MED ORDER — OXYCODONE-ACETAMINOPHEN 5-325 MG PO TABS: 1.0000 | ORAL_TABLET | Freq: Four times a day (QID) | ORAL | Status: DC | PRN

## 2015-05-29 NOTE — Progress Notes (Signed)
    MRN: [id number] DOB: [birth year]  Subjective:   Timothy Rice is a 64 y.o. male presenting for chief complaint of Follow-up and right tibia  Reports suffering a fall while at work on 05/26/2015. He was on a 6 foot ladder and fell ~3-4 feet. He was taken to the ED immediately and found to have a medial malleolar and tibial fracture. Patient was placed in a splint and told to see his PCP for referral to ortho. Since then, patient has been pretty steady, taking Percocet for pain. He would like a refill on this since he is almost out. He denies worsening swelling, pain and reinjury.  Timothy Rice has a current medication list which includes the following prescription(s): oxycodone-acetaminophen and rosuvastatin. Also has No Known Allergies.  Timothy Rice  has a past medical history of Hypercholesteremia. Also  has no past surgical history on file.  Objective:   Vitals: BP 110/66 mmHg  Pulse 90  Temp(Src) 98.3 F (36.8 C) (Oral)  Resp 16  Ht   Wt   SpO2 96%  Physical Exam  Constitutional: He is oriented to person, place, and time. He appears well-developed and well-nourished.  Cardiovascular: Normal rate.   Pulmonary/Chest: Effort normal.  Musculoskeletal:  Patient is in a right long heel splint that is immobilizing his right ankle.   Neurological: He is alert and oriented to person, place, and time.  Skin: Skin is warm and dry. No rash noted. No erythema. No pallor.  Psychiatric: He has a normal mood and affect.   Assessment and Plan :   1. Malleolar fracture, right, closed, initial encounter - Ambulatory referral to Orthopedic Surgery. Patient's daughter offered her cell, [phone] Timothy Rice, to call with appointment time.  Timothy Eagles, PA-C [facility] [phone] 05/29/2015 5:01 PM

## 2015-05-29 NOTE — Patient Instructions (Signed)
Docusate capsules Qu es este medicamento? El DOCUSATO es un ablandador fecal. Ayuda a prevenir el estreimiento y los esfuerzos para defecar o las molestias asociadas con las heces [place]. Este medicamento puede ser utilizado para otros usos; si tiene alguna pregunta consulte con su proveedor de atencin mdica o con su farmacutico. Qu le debo informar a mi profesional de la salud antes de tomar este medicamento? Necesita saber si usted presenta alguno de los siguientes problemas o situaciones: -nuseas o vmito -estreimiento severo -dolor de estmago -cambio repentino en el hbito intestinal que dura ms de 2 semanas -una reaccin alrgica o inusual al docusato, otros medicamentos, alimentos, colorantes o conservantes -si est embarazada o buscando quedar embarazada -si est amamantando a un beb Cmo debo utilizar este medicamento? Tome este medicamento por va oral con un vaso de agua. Siga las instrucciones de la etiqueta del [place]. Tome sus dosis a intervalos regulares. No tome su medicamento con una frecuencia mayor a la indicada. Hable con su pediatra para informarse acerca del uso de este medicamento en nios. Aunque este medicamento ha sido recetado a nios tan menores como de 2 aos de edad para condiciones selectivas, las precauciones se aplican. Sobredosis: Pngase en contacto inmediatamente con un centro toxicolgico o una sala de urgencia si usted cree que haya tomado demasiado medicamento. ATENCIN: [employer] es solo para usted. No comparta este medicamento con nadie. Qu sucede si me olvido de una dosis? Si olvida una dosis, tmela lo antes posible. Si es casi la hora de la prxima dosis, tome slo esa dosis. No tome dosis adicionales o dobles. Qu puede interactuar con este medicamento? -aceite mineral Puede ser que esta lista no menciona todas las posibles interacciones. Informe a su profesional de [employer]	[place] de [employer] productos a base de hierbas,  medicamentos de [place] o suplementos nutritivos que est tomando. Si usted fuma, consume bebidas alcohlicas o si utiliza drogas ilegales, indqueselo tambin a su profesional de [employer]	[place]. Algunas sustancias pueden interactuar con su medicamento. A qu debo estar atento al usar [employer]? No lo utilice durante ms de una semana sin Teacher, adult education a su mdico o a su profesional de Technical sales engineer. Consulte a su mdico o a su profesional de la salud si el estreimiento reaparece. Beba agua en abundancia mientras est tomando este medicamento para ayudar a Scientist, research (physical sciences). Deje de usar este medicamento y comunquese con su mdico o su profesional de la salud si experimenta sangrado rectal o no evacua los intestinos despus de usar. stos pueden ser sntomas de una enfermedad ms grave. Qu efectos secundarios puedo tener al [employer] este medicamento? Efectos secundarios que debe informar a su mdico o a Barrister's clerk de la salud tan pronto como sea posible: -reacciones alrgicas como erupcin cutnea, picazn o urticarias, hinchazn de la cara, labios o lengua Efectos secundarios que, por lo general, no requieren atencin mdica (debe informarlos a su mdico o a su profesional de la salud si persisten o si son molestos): -diarrea -calambres estomacales -irritacin de la garganta Puede ser que esta lista no menciona todos los posibles efectos secundarios. Comunquese a su mdico por asesoramiento mdico [employer]. Usted puede informar los efectos secundarios a la FDA por telfono al 1-800-FDA-1088. Dnde debo guardar mi medicina? Mantngala fuera del alcance de los nios. [place] a [employer], entre 15 y 51 grados C (59 y 85 grados F). Deseche todo el medicamento que no haya utilizado, despus de la fecha de vencimiento. ATENCIN:  Este folleto es un resumen. Puede ser que no cubra toda la posible informacin. Si usted tiene preguntas acerca de esta medicina,  consulte con su mdico, su farmacutico o su profesional de Technical sales engineer.    2016, Elsevier/Gold Standard. (2014-07-26 00:00:00)    [place] de tobillo (Ankle Fracture) Ardelia Mems fractura es la ruptura de un hueso. La articulacin del tobillo est compuesta por tres huesos. [place] secciones inferiores (distales) de los huesos de la extremidad inferior, llamados tibia y peron, junto con un hueso del pie, Insurance claims handler. En funcin de la gravedad de la fractura y si hay ms de un hueso de la articulacin del tobillo fracturado, se [place] un yeso o una frula para proteger el hueso fracturado e impedir que este se [place] se suelda. A veces, es necesario realizar la ciruga para ayudar a que la fractura suelde correctamente.  Hay dos tipos generales de fracturas:  Fractura estable. En este tipo de fractura hay una sola lnea de la fractura que atraviesa un hueso sin lesiones en los ligamentos del tobillo. La fractura del astrgalo sin desplazamiento (movimiento del hueso hacia uno de los lados de la lnea de la fractura) tambin es estable.  Fractura inestable. En este tipo de fractura hay ms de una lnea de la fractura que atraviesan uno o ms huesos de la articulacin del tobillo. [place] fracturas con desplazamiento del hueso hacia uno de los lados de la lnea de Electrical engineer. CAUSAS  Un golpe directo en el tobillo.  Una torcedura rpida y grave del tobillo.  Un traumatismo, como un accidente automovilstico o una cada de una altura importante. FACTORES DE RIESGO Puede tener un riesgo ms alto de sufrir una fractura de tobillo si:  Tiene ciertas enfermedades crnicas.  Practica deportes de alto impacto.  Tiene un accidente automovilstico de alto impacto. [place] en el tobillo.  Hematomas alrededor del tobillo lesionado.  Dolor al Interior and spatial designer.  Dificultad para caminar o para soportar peso en el tobillo.  Pie fro por  debajo del lugar de la lesin del tobillo. Esto puede ocurrir si los vasos sanguneos que atraviesan el tobillo lesionado tambin se daaron.  Adormecimiento del pie por debajo del lugar de la lesin del tobillo. DIAGNSTICO  Generalmente, la fractura de tobillo se diagnostica mediante un examen fsico y radiografas. Tambin puede ser necesario realizar una tomografa computarizada si la fractura es compleja. [place], se coloca un yeso o una frula y se usan muletas para no recargar el tobillo lesionado. A esto le sigue un programa de fortalecimiento del tobillo. Algunos pacientes necesitan un tipo especial de yeso, en funcin de otros problemas mdicos que pueden tener. Las fracturas inestables requieren [place] para asegurarse de [place] [employer] se suelden correctamente. El [employer] informar qu tipo de fractura tiene y cul es el mejor tratamiento para su afeccin. INSTRUCCIONES PARA EL CUIDADO EN EL HOGAR   Revise con el mdico cul es la mejor forma de usar las [place] y selas como se lo indiquen. El uso seguro de las muletas es muy importante. El uso indebido de las [employer] puede provocarle cadas o causar lesiones en los nervios de las manos o las [place].  No recargue ni ejerza presin en el tobillo lesionado hasta tanto el mdico se lo indique.  Para disminuir la hinchazn, mantenga elevada la pierna lesionada mientras est sentado o acostado.  Aplique hielo sobre la zona lesionada.  Ponga el hielo en una bolsa plstica.  Coloque una toalla entre el yeso y la bolsa de hielo.  Deje el hielo durante 20 minutos, 2 a 3 veces por da.  Si le colocaron un yeso o un molde de [place] de vidrio:  No trate de rascarse la piel por debajo del yeso con ningn objeto. Esto puede aumentar el riesgo de infecciones cutneas.  [place] piel de alrededor del yeso. Puede colocarse una locin en las zonas rojas o doloridas.  Mantenga el yeso  seco y limpio.  Si tiene una frula de yeso:  Se la frula del modo en que se lo indicaron.  Puede aflojar el elstico que rodea la frula si los dedos se entumecen, siente hormigueos, se enfran o se vuelven de color azul.  No ejerza presin en ninguna parte del yeso o frula; podra romperse. Durante las primeras 24 horas mantenga el yeso sobre una almohada hasta que est completamente duro.  Es posible proteger el yeso o la frula durante el bao con una bolsa de plstico sellada sobre la piel con [place]. No los sumerja en el agua.  Tome todos los medicamentos como le indic el mdico. Utilice los medicamentos de venta libre o recetados para Glass blower/designer, Health and safety inspector o la fiebre, segn se lo indique el mdico.  No conduzca vehculos hasta que el mdico le diga especficamente que puede hacerlo con seguridad.  Si el mdico le ha dado fecha para una visita de control, es importante que concurra. No concurrir a la visita puede derivar en que el dao, el dolor o la discapacidad sean permanentes o crnicos. Si tiene problemas para cumplir con la visita, llame al centro para pedir ayuda. SOLICITE ATENCIN MDICA SI: Aumenta la hinchazn o la molestia. SOLICITE ATENCIN MDICA DE INMEDIATO SI:   Su yeso se daa o se rompe.  Tiene dolor intenso y continuo.  Siente un nuevo dolor o presenta hinchazn despus de la colocacin del yeso.  [place] uas del pie que estn por debajo de la lesin se le ponen azules o grises.  [place] uas del pie que estn por debajo de la lesin estn fras, adormecidas o pierde la sensibilidad al tacto.  Siente mal olor u observa secrecin debajo del yeso. ASEGRESE DE QUE:   Comprende estas instrucciones.  Controlar su afeccin.  Recibir ayuda de inmediato si no mejora o si empeora.   Esta informacin no tiene Marine scientist el consejo del mdico. Asegrese de hacerle al mdico cualquier pregunta que tenga.   Document  Released: 06/03/2005 Document Revised: 06/08/2013 Elsevier Interactive Patient Education [employer].

## 2015-06-29 ENCOUNTER — Ambulatory Visit: Payer: 59 | Admitting: Urgent Care

## 2016-02-10 IMAGING — DX DG ANKLE COMPLETE 3+V*R*
3 series · 3 of 3 positions shown · non-contrast
Comparison: No priors.

CLINICAL DATA: 64-year-old male with history of trauma from a fall
off a ladder complaining of right ankle pain.

EXAM:
RIGHT ANKLE - COMPLETE 3+ VIEW

[ankle ap]
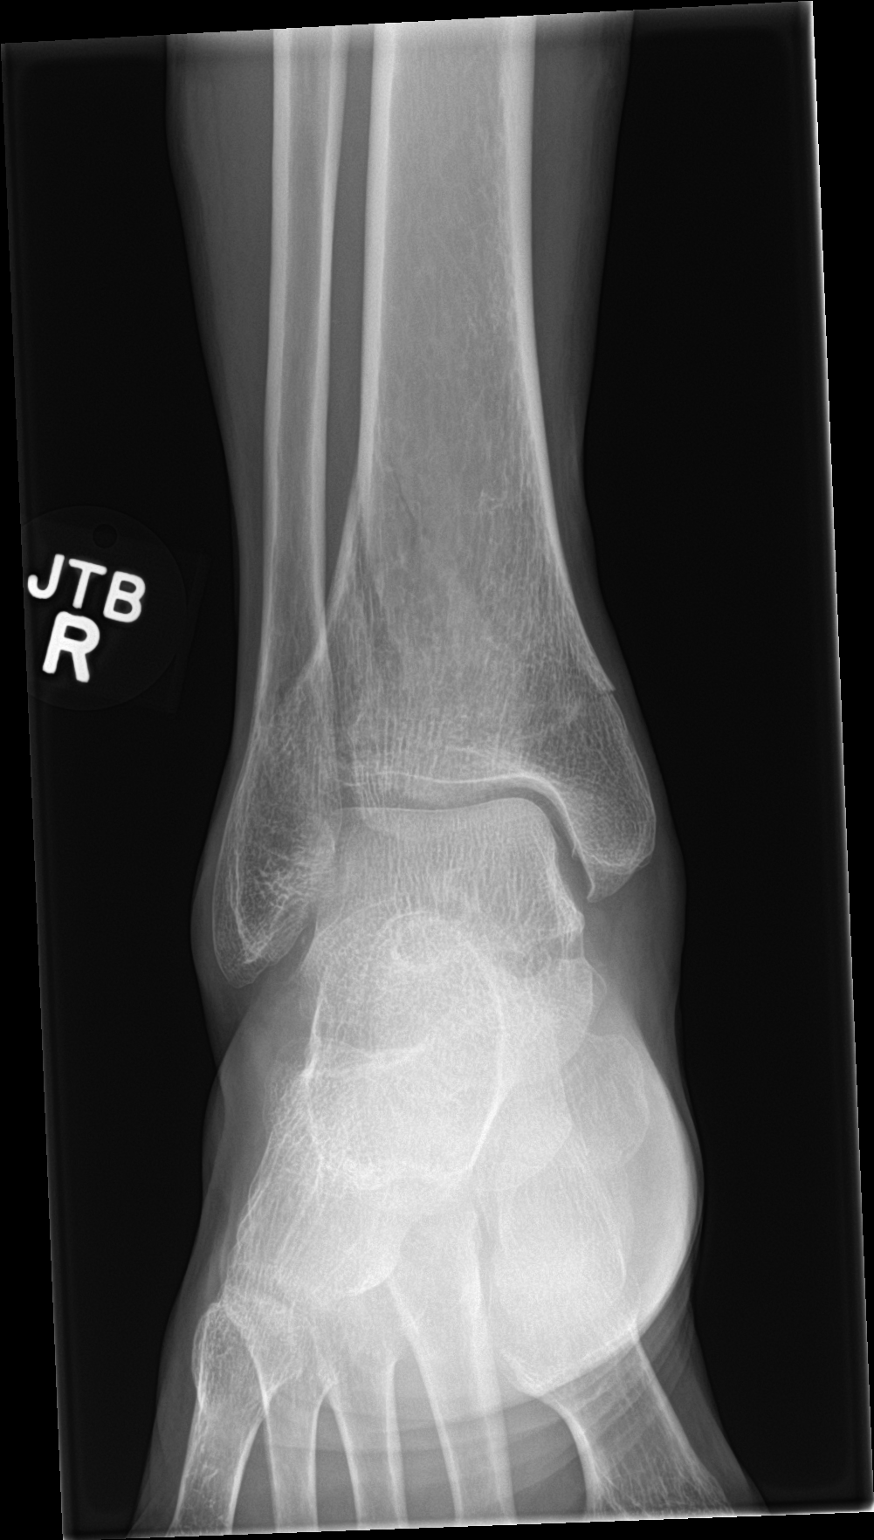

[ankle obl]
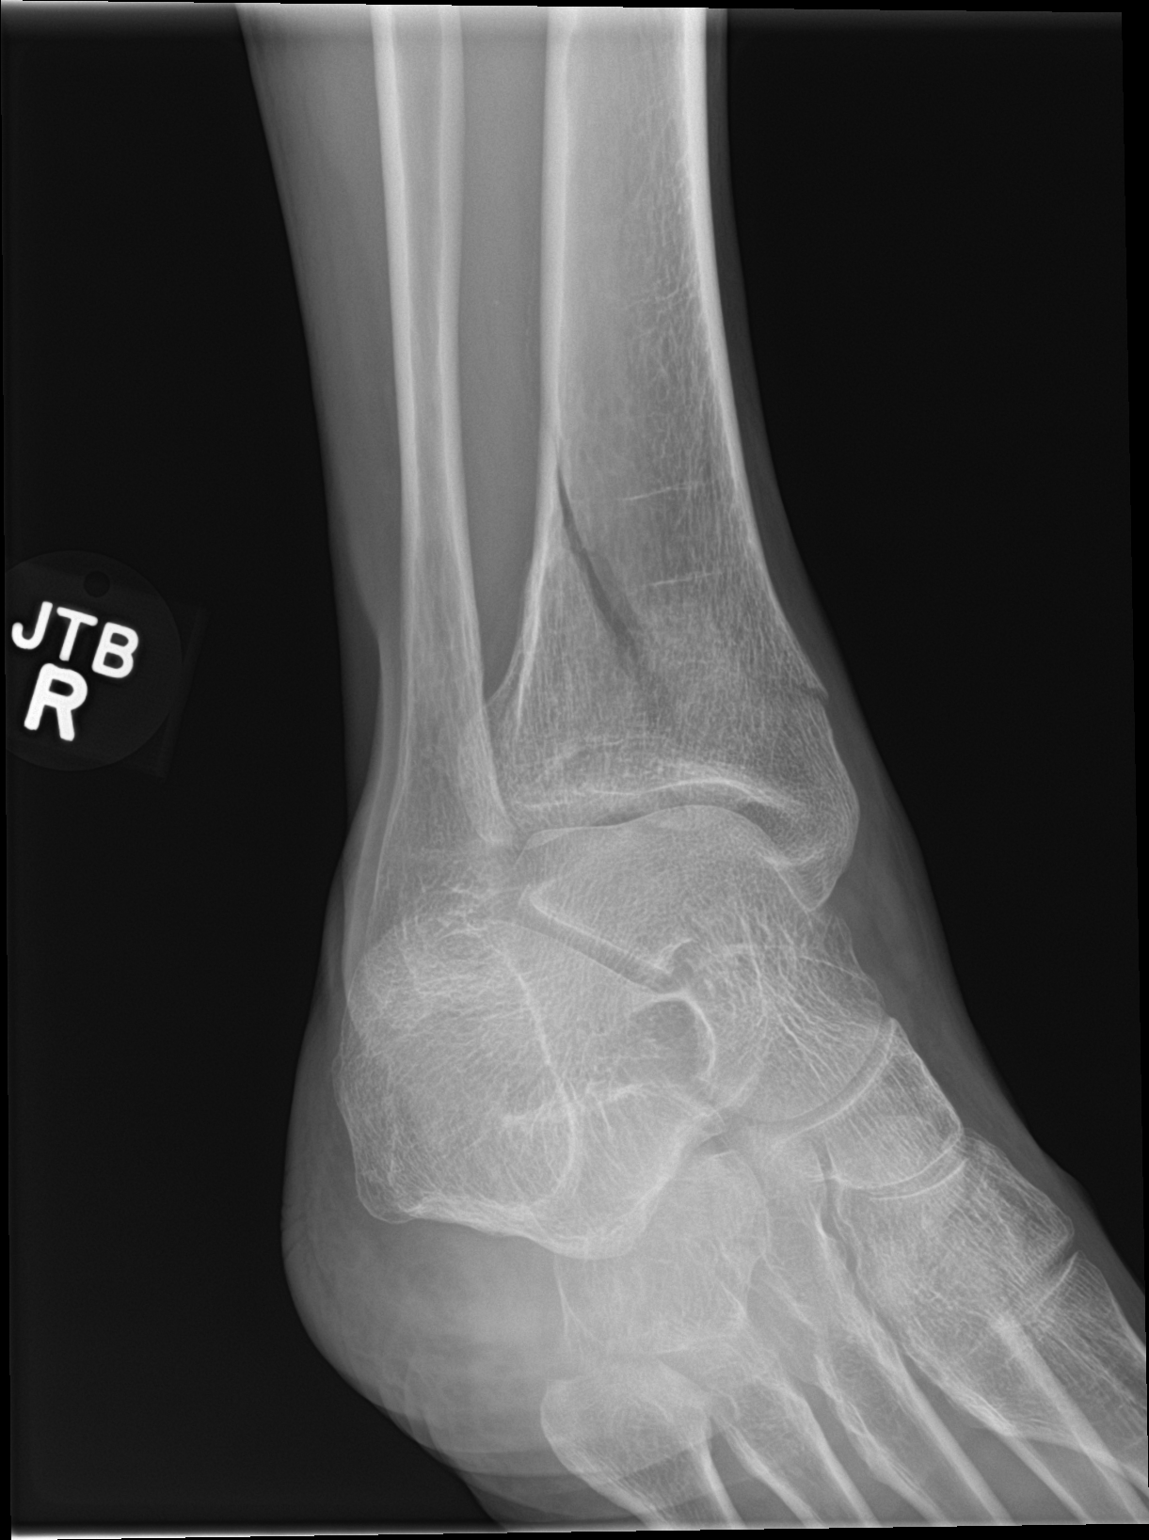

[ankle lat]
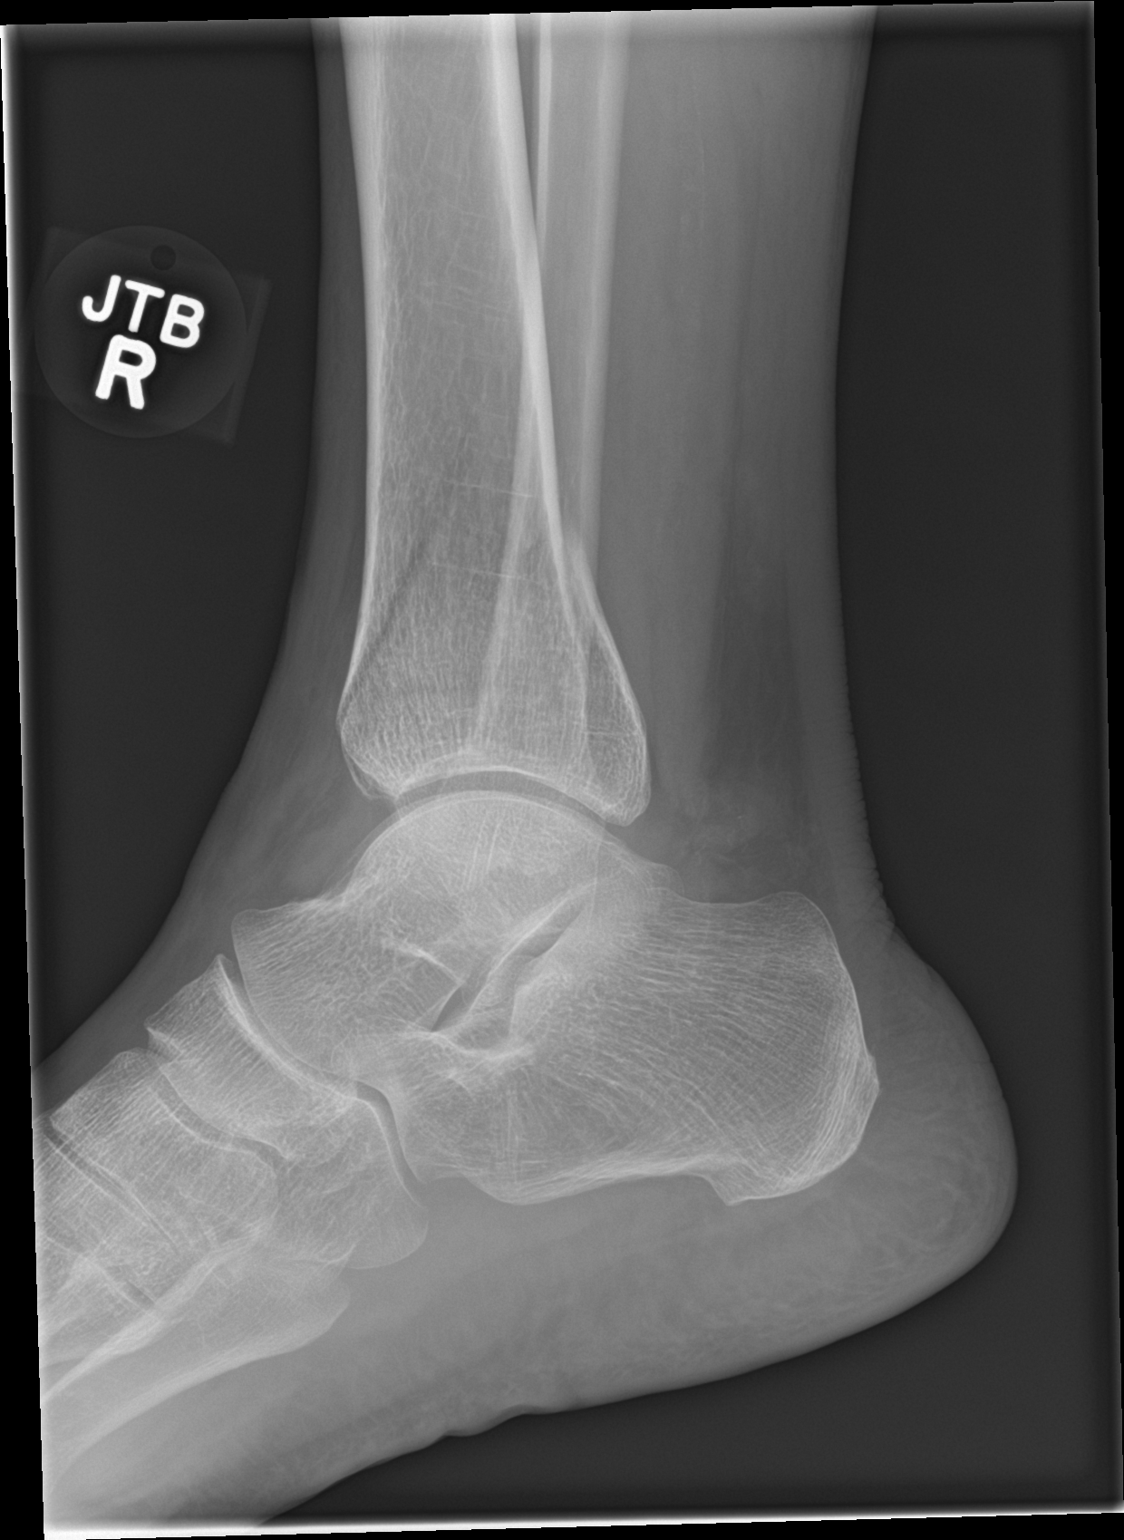

[3 of 3 positions shown; findings below may reference images not displayed]

FINDINGS: Three views of the right ankle demonstrate a nondisplaced oblique
fracture of the distal metaphyseal region of the tibia. Whether or
not this extends to the articular surface is uncertain, however, on
the oblique projection, possible intra-articular extension is noted.
The distal fibula appears intact. Ankle mortise appears preserved.
Soft tissue swelling is noted around the ankle joint.
IMPRESSION: 1. Acute nondisplaced oblique fracture through the distal right
tibial metaphysis. Potential intra-articular extension is noted, but
is uncertain. Further evaluation with right ankle CT scan could be
performed to determine whether or not there is intra-articular
extension if clinically appropriate.

## 2016-04-08 ENCOUNTER — Ambulatory Visit (INDEPENDENT_AMBULATORY_CARE_PROVIDER_SITE_OTHER): Payer: BLUE CROSS/BLUE SHIELD | Admitting: Urgent Care

## 2016-04-08 VITALS — BP 110/72 | HR 69 | Temp 97.7°F | Resp 18 | Ht 63.0 in | Wt 149.0 lb

## 2016-04-08 DIAGNOSIS — Z0000 Encounter for general adult medical examination without abnormal findings: Secondary | ICD-10-CM

## 2016-04-08 DIAGNOSIS — K458 Other specified abdominal hernia without obstruction or gangrene: Secondary | ICD-10-CM

## 2016-04-08 DIAGNOSIS — R42 Dizziness and giddiness: Secondary | ICD-10-CM

## 2016-04-08 DIAGNOSIS — Z23 Encounter for immunization: Secondary | ICD-10-CM

## 2016-04-08 LAB — CBC
HCT: 40.7 % (ref 38.5–50.0)
Hemoglobin: 14.1 g/dL (ref 13.2–17.1)
MCH: 31.2 pg (ref 27.0–33.0)
MCHC: 34.6 g/dL (ref 32.0–36.0)
MCV: 90 fL (ref 80.0–100.0)
MPV: 10.2 fL (ref 7.5–12.5)
PLATELETS: 218 10*3/uL (ref 140–400)
RBC: 4.52 MIL/uL (ref 4.20–5.80)
RDW: 13.1 % (ref 11.0–15.0)
WBC: 4 10*3/uL (ref 3.8–10.8)

## 2016-04-08 LAB — COMPLETE METABOLIC PANEL WITH GFR
ALK PHOS: 86 U/L (ref 40–115)
ALT: 42 U/L (ref 9–46)
AST: 36 U/L — ABNORMAL HIGH (ref 10–35)
Albumin: 3.9 g/dL (ref 3.6–5.1)
BUN: 16 mg/dL (ref 7–25)
CO2: 25 mmol/L (ref 20–31)
Calcium: 9 mg/dL (ref 8.6–10.3)
Chloride: 105 mmol/L (ref 98–110)
Creat: 1.25 mg/dL (ref 0.70–1.25)
GFR, EST NON AFRICAN AMERICAN: 60 mL/min (ref >=60)
GFR, Est African American: 70 mL/min (ref >=60)
GLUCOSE: 107 mg/dL — AB (ref 65–99)
POTASSIUM: 4.2 mmol/L (ref 3.5–5.3)
SODIUM: 137 mmol/L (ref 135–146)
Total Bilirubin: 1.8 mg/dL — ABNORMAL HIGH (ref 0.2–1.2)
Total Protein: 6.6 g/dL (ref 6.1–8.1)

## 2016-04-08 LAB — TSH: TSH: 0.97 mIU/L (ref 0.40–4.50)

## 2016-04-08 LAB — LIPID PANEL
CHOL/HDL RATIO: 14 ratio — AB (ref <=5.0)
Cholesterol: 590 mg/dL — ABNORMAL HIGH (ref 125–200)
HDL: 42 mg/dL (ref >=40)
LDL Cholesterol: 400 mg/dL — ABNORMAL HIGH (ref <130)
Triglycerides: 738 mg/dL — ABNORMAL HIGH (ref <150)

## 2016-04-08 LAB — HIV ANTIBODY (ROUTINE TESTING W REFLEX): HIV: NONREACTIVE

## 2016-04-08 MED ORDER — ZOSTER VACCINE LIVE 19400 UNT/0.65ML ~~LOC~~ SUSR: 0.6500 mL | Freq: Once | SUBCUTANEOUS | 0 refills | Status: DC

## 2016-04-08 NOTE — Patient Instructions (Addendum)
Keeping you healthy  Get these tests  Blood pressure- Have your blood pressure checked once a year by your healthcare provider.  Normal blood pressure is 120/80  Weight- Have your body mass index (BMI) calculated to screen for obesity.  BMI is a measure of body fat based on height and weight. You can also calculate your own BMI at ViewBanking.si.  Cholesterol- Have your cholesterol checked every year.  Diabetes- Have your blood sugar checked regularly if you have high blood pressure, high cholesterol, have a family history of diabetes or if you are overweight.  Screening for Colon Cancer- Colonoscopy starting at age 32.  Screening may begin sooner depending on your family history and other health conditions. Follow up colonoscopy as directed by your Gastroenterologist.  Screening for Prostate Cancer- Both blood work (PSA) and a rectal exam help screen for Prostate Cancer.  Screening begins at age 71 with African-American men and at age 89 with Caucasian men.  Screening may begin sooner depending on your family history.  Take these medicines  Aspirin- One aspirin daily can help prevent Heart disease and Stroke.  Flu shot- Every fall.  Tetanus- Every 10 years.  Zostavax- Once after the age of 64 to prevent Shingles.  Pneumonia shot- Once after the age of 61; if you are younger than 62, ask your healthcare provider if you need a Pneumonia shot.  Take these steps  Don't smoke- If you do smoke, talk to your doctor about quitting.  For tips on how to quit, go to www.smokefree.gov or call 1-800-QUIT-NOW.  Be physically active- Exercise 5 days a week for at least 30 minutes.  If you are not already physically active start slow and gradually work up to 30 minutes of moderate physical activity.  Examples of moderate activity include walking briskly, mowing the yard, dancing, swimming, bicycling, etc.  Eat a healthy diet- Eat a variety of healthy food such as fruits, vegetables, low  fat milk, low fat cheese, yogurt, lean meant, poultry, fish, beans, tofu, etc. For more information go to www.thenutritionsource.org  Drink alcohol in moderation- Limit alcohol intake to less than two drinks a day. Never drink and drive.  Dentist- Brush and floss twice daily; visit your dentist twice a year.  Depression- Your emotional health is as important as your physical health. If you're feeling down, or losing interest in things you would normally enjoy please talk to your healthcare provider.  Eye exam- Visit your eye doctor every year.  Safe sex- If you may be exposed to a sexually transmitted infection, use a condom.  Seat belts- Seat belts can save your life; always wear one.  Smoke/Carbon Monoxide detectors- These detectors need to be installed on the appropriate level of your home.  Replace batteries at least once a year.  Skin cancer- When out in the sun, cover up and use sunscreen 15 SPF or higher.  Violence- If anyone is threatening you, please tell your healthcare provider.  Living Will/ Health care power of attorney- Speak with your healthcare provider and family.    Hernia en los adultos (Hernia, Adult) Una hernia es la protrusin de un rgano o un tejido a travs de un punto dbil en los msculos del abdomen (pared abdominal). La mayora de las veces, las hernias aparecen cerca del ombligo o de la ingle. Hay muchos tipos de hernias. Los ms frecuentes incluyen los siguientes:  Hernia crural. Este tipo de hernia aparece por debajo de la ingle en la regin superior del muslo.  Hernia inguinal. Este tipo de hernia aparece en la ingle o en el escroto.  Hernia umbilical. Este tipo de hernia aparece cerca del ombligo.  Hernia de hiato. Este tipo de hernia produce el desplazamiento de una porcin del estmago hacia el trax.  Hernia incisional. Este tipo de hernia sobresale a travs de una cicatriz de una ciruga abdominal. CAUSAS Este trastorno puede ser causado  por:  Levantar peso excesivo.  [employer].  Dificultad para defecar.  Una incisin realizada durante una ciruga abdominal.  Un defecto congnito.  Sobrepeso u obesidad.  Fumar.  Dficit nutricional.  Fibrosis qustica.  Exceso de lquido en el abdomen.  Criptorquidia (testculos retenidos). SNTOMAS Los sntomas de una hernia incluyen lo siguiente:  Un bulto en el abdomen, que es el primer signo de una hernia. El bulto puede volverse ms evidente al estar de pie, hacer esfuerzos o toser. Puede aumentar de tamao con el tiempo si no se lo trata o si no se trata la afeccin que lo causa.  Dolor. Generalmente, las hernias son indoloras, pero pueden volverse dolorosas con el tiempo si se retrasa el [place]. El dolor suele ser sordo y puede ser ms intenso al estar de pie o levantar objetos pesados. A veces, una hernia queda constreida en el punto dbil (estrangulada) o atascada (encarcelada) y causa ms sntomas. Estos sntomas pueden incluir los siguientes:  Vmitos.  Nuseas.  Estreimiento.  Irritabilidad. DIAGNSTICO El diagnstico de una hernia puede realizarse mediante lo siguiente:  Un examen fsico. Durante el examen, el mdico puede pedirle que tosa o haga un movimiento especfico, porque, por lo general, una hernia es ms visible cuando la persona se mueve.  Diagnstico por imgenes. Estos pueden incluir los siguientes:  Radiografas.  Ecografa.  Tomografa computarizada. TRATAMIENTO Es posible que una hernia pequea e indolora no necesite tratamiento. Una hernia grande o dolorosa puede tratarse con [place]. Para tratar las hernias inguinales, se puede recurrir a [place] para Midwife. Las hernias estranguladas siempre se tratan con ciruga, porque la falta de irrigacin sangunea al rgano o al tejido atascados puede causar su muerte. La ciruga para tratar una hernia incluye volver a Dispensing optician  bulto en su lugar y reparar la zona dbil del abdomen. INSTRUCCIONES PARA EL CUIDADO EN EL HOGAR  No haga esfuerzos.  No levante ningn objeto que pese ms de 10libras (4,5kg).  Para hacerlo, use los msculos de las piernas, no los de la espalda. Esto ayuda a Ship broker.  Cuando tosa, hgalo con suavidad.  Evitar el estreimiento. El estreimiento obliga a Water quality scientist de Sport and exercise psychologist, los cuales pueden agravar una hernia o causar la rotura de la reparacin. Para evitar el estreimiento, puede hacer lo siguiente:  Consuma una dieta con alto contenido de fibras que incluya gran cantidad de frutas y verduras.  Beba suficiente lquido para Consulting civil engineer orina clara o de color amarillo plido. Pngase como objetivo beber 6 u 8vasos de Public affairs consultant.  Tome un ablandador de [employer] se lo haya indicado el mdico.  [place] de [place], si tiene sobrepeso.  No consuma ningn producto que contenga tabaco, lo que incluye cigarrillos, tabaco de Higher education careers adviser o Psychologist, sport and exercise. Si necesita ayuda para dejar de fumar, consulte al mdico.  Concurra a todas las visitas de control como se lo haya indicado el mdico. Esto es importante. Es posible que el mdico deba controlar su cuadro clnico. SOLICITE ATENCIN MDICA SI:  Tiene enrojecimiento, hinchazn o dolor en  la zona afectada.  Sus hbitos intestinales han [place]. SOLICITE ATENCIN MDICA DE INMEDIATO SI:  Tiene fiebre.  Tiene dolor abdominal que empeora.  Siente nuseas o vomita.  No puede volver a Public affairs consultant hernia en su lugar al ejercer sobre esta una presin suave mientras est acostado.  La hernia:  Cambia de forma o de tamao.  Queda atascada fuera del abdomen.  Cambia de color.  Est dura al tacto o le causa dolor a la palpacin.   Esta informacin no tiene Marine scientist el consejo del mdico. Asegrese de hacerle al mdico cualquier pregunta que tenga.   Document Released: 06/03/2005 Document  Revised: 06/24/2014 Elsevier Interactive Patient Education [employer].    IF you received an x-ray today, you will receive an invoice from [facility]. Please contact [facility] Rad[facility] at [phone] with questions or concerns regarding your invoice.   IF you received labwork today, you will receive an invoice from [employer]. Please contact [facility] at [phone] with questions or concerns regarding your invoice.   Our billing staff will not be able to assist you with questions regarding bills from these companies.  You will be contacted with the lab results as soon as they are available. The fastest way to get your results is to activate your My Chart account. Instructions are located on the last page of this paperwork. If you have not heard from [place] regarding the results in 2 weeks, please contact this office.

## 2016-04-08 NOTE — Progress Notes (Addendum)
Patient ID: Timothy Rice, male   DOB: 1950-10-09, 65 y.o.   MRN: [id number]   By signing my name below, I, [name], attest that this documentation has been prepared under the direction and in the presence of [name], PA-C Electronically Signed: Ladene Artist, ED Scribe 04/08/2016 at 9:32 AM.  MRN: [id number]  Subjective:   Timothy Rice is a 65 y.o. male presenting for annual physical exam.  Medical care team includes: PCP: No PCP Per Patient Vision: 1 month ago; plans on filling new script Dental: None. Specialists: None.   Pt is married. Has 2 adult children. Good support network and relations at home. 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 Pt practices a healthy diet. Denies smoking cigarettes and has 1 drink per week.   Health Maintenance: Updated flu shot, tdap is up to date, shingles vaccine script printed.  Timothy Rice does not have any active problems on his problem list.    Current Outpatient Prescriptions:    aspirin EC 81 MG tablet, Take 81 mg by mouth daily., Disp: , Rfl:   He has No Known Allergies.  Drevyn  has a past medical history of Closed right ankle fracture (2016) and Hypercholesteremia. Also denies past surgical history.   His mother had a stroke at 30, passed from MI at 15. His father has had 2 strokes and is still living.   Review of Systems  Constitutional: Negative for chills, diaphoresis, fever, malaise/fatigue and weight loss.  HENT: Negative for congestion, ear discharge, ear pain, hearing loss, nosebleeds, sore throat and tinnitus.   Eyes: Negative for blurred vision, double vision, photophobia, pain, discharge and redness.  Respiratory: Negative for cough, shortness of breath and wheezing.   Cardiovascular: Negative for chest pain, palpitations and leg swelling.  Gastrointestinal: Negative for abdominal pain, blood in stool, constipation, diarrhea, nausea and vomiting.       +Hernia around umbilicus.  Genitourinary:  Negative for dysuria, flank pain, frequency, hematuria and urgency.  Musculoskeletal: Negative for back pain, joint pain and myalgias.  Skin: Negative for itching and rash.  Neurological: Positive for dizziness (reports occasional and transient dizziness). Negative for tingling, seizures, loss of consciousness, weakness and headaches.  Endo/Heme/Allergies: Negative for polydipsia.  Psychiatric/Behavioral: Negative for depression, hallucinations, memory loss, substance abuse and suicidal ideas. The patient is not nervous/anxious and does not have insomnia.   All other systems reviewed and are negative.  Objective:   Vitals: BP 110/72 (BP Location: Right Arm, Patient Position: Sitting, Cuff Size: Small)    Pulse 69    Temp 97.7 F (36.5 C) (Oral)    Resp 18    Ht 5\' 3"  (1.6 m)    Wt 149 lb (67.6 kg)    SpO2 100%    BMI 26.39 kg/m   Physical Exam  Constitutional: He is oriented to person, place, and time. He appears well-developed and well-nourished.  HENT:  TM's intact bilaterally, no effusions or erythema. Nasal turbinates pink and moist, nasal passages patent. No sinus tenderness. Oropharynx clear, mucous membranes moist, dentition in good repair.  Eyes: Conjunctivae and EOM are normal. Pupils are equal, round, and reactive to light. Right eye exhibits no discharge. Left eye exhibits no discharge. No scleral icterus.  Neck: Normal range of motion. Neck supple. No thyromegaly present.  Cardiovascular: Normal rate, regular rhythm and intact distal pulses.  Exam reveals no gallop and no friction rub.   No murmur heard. Pulmonary/Chest: No stridor. No respiratory distress. He has no wheezes. He  has no rales.  Abdominal: Soft. Bowel sounds are normal. He exhibits no distension and no mass. There is no tenderness.  Musculoskeletal: Normal range of motion. He exhibits no edema or tenderness.  Lymphadenopathy:    He has no cervical adenopathy.  Neurological: He is alert and oriented to person,  place, and time. He has normal reflexes.  Skin: Skin is warm and dry. No rash noted. No erythema. No pallor.  Psychiatric: He has a normal mood and affect.   Assessment and Plan :   1. Annual physical exam - Labs pending, patient is medically healthy. Discussed healthy lifestyle, diet, exercise, preventative care, vaccinations and addressed patient's concerns.   2. Other specified abdominal hernia without obstruction or gangrene - Physical exam reassuring, will refer for general surgery consult.   3. Dizziness - Labs pending, advised adequate hydration and regular healthy meals, f/u if symptoms persist.  4. Need for influenza vaccination - Flu Vaccine QUAD 36+ mos IM   5. Need for shingles vaccine - Script printed.   [name], PA-C [facility] [phone] 04/08/2016  9:32 AM

## 2016-04-10 ENCOUNTER — Other Ambulatory Visit: Payer: Self-pay | Admitting: Urgent Care

## 2016-04-10 DIAGNOSIS — E782 Mixed hyperlipidemia: Secondary | ICD-10-CM

## 2016-04-10 MED ORDER — ATORVASTATIN CALCIUM 40 MG PO TABS: 40.0000 mg | ORAL_TABLET | Freq: Every day | ORAL | 3 refills | Status: DC

## 2016-07-25 DIAGNOSIS — K439 Ventral hernia without obstruction or gangrene: Secondary | ICD-10-CM | POA: Insufficient documentation

## 2017-11-18 ENCOUNTER — Ambulatory Visit (HOSPITAL_COMMUNITY)
Admission: EM | Admit: 2017-11-18 | Discharge: 2017-11-18 | Disposition: A | Discharge status: Other Acute Inpatient Hospital | Payer: Self-pay

## 2017-12-04 ENCOUNTER — Ambulatory Visit (INDEPENDENT_AMBULATORY_CARE_PROVIDER_SITE_OTHER): Payer: Medicaid Other | Admitting: Family Medicine

## 2017-12-04 ENCOUNTER — Encounter: Payer: Self-pay | Admitting: Family Medicine

## 2017-12-04 VITALS — BP 131/86 | HR 67 | Temp 97.9°F | Resp 14 | Ht 64.0 in | Wt 157.0 lb

## 2017-12-04 DIAGNOSIS — H8103 Meniere's disease, bilateral: Secondary | ICD-10-CM

## 2017-12-04 DIAGNOSIS — E785 Hyperlipidemia, unspecified: Secondary | ICD-10-CM | POA: Insufficient documentation

## 2017-12-04 DIAGNOSIS — H04203 Unspecified epiphora, bilateral lacrimal glands: Secondary | ICD-10-CM | POA: Diagnosis not present

## 2017-12-04 DIAGNOSIS — R030 Elevated blood-pressure reading, without diagnosis of hypertension: Secondary | ICD-10-CM

## 2017-12-04 DIAGNOSIS — Z1159 Encounter for screening for other viral diseases: Secondary | ICD-10-CM

## 2017-12-04 DIAGNOSIS — Z789 Other specified health status: Secondary | ICD-10-CM | POA: Diagnosis not present

## 2017-12-04 DIAGNOSIS — Z23 Encounter for immunization: Secondary | ICD-10-CM

## 2017-12-04 LAB — POCT GLYCOSYLATED HEMOGLOBIN (HGB A1C): Hemoglobin A1C: 5.4 % (ref 4.0–5.6)

## 2017-12-04 MED ORDER — MECLIZINE HCL 25 MG PO TABS: 25.0000 mg | ORAL_TABLET | Freq: Three times a day (TID) | ORAL | 1 refills | Status: DC | PRN

## 2017-12-04 NOTE — Patient Instructions (Addendum)
Gracias por establecer atencin. No hay cambios de medicacin justificados hoy.  Recomendar una dieta baja en grasas y alta en fibra que incluya frutas y verduras.   Seguir por telfono con cualquier resultado anormal de laboratorio.  Contine con meclizina 25 mg tres veces al da segn sea necesario para la enfermedad de Meniere.  Contine la atorvastatina a la dosis actual y aspirina 81 mg.    Seguimiento en 1 semana para comprobacin bp    Thanks for establishing care. No medication changes warranted on today.  Recommend a low fat, high fiber diet including fruits and vegetables.   Will follow up by phone with any abnormal laboratory results.  Continue Meclizine 25 mg three times daily as needed for Meniere's Disease.  Continue atorvastatin at current dose and aspirin 81 mg.    Follow up in 1 week for bp check   Opciones de alimentos para bajar el nivel de triglicridos (Food Choices to Lower Your Triglycerides) Los triglicridos son un tipo de grasas que se Chief Strategy Officer. Un nivel elevado de triglicridos puede aumentar el riesgo de padecer enfermedades cardacas e infartos. Si sus niveles de triglicridos son altos, los alimentos que se ingieren y los hbitos de alimentacin son Theatre stage manager. Elegir los alimentos adecuados puede ayudar a Therapist, nutritional de triglicridos. [place]?  Baje de peso si es necesario.  Limite o evite el alcohol.  Llene la mitad del plato con vegetales y ensaladas de hojas verdes.  [place] a dos porciones por da. Elija frutas en lugar de jugos.  Ocupe un cuarto del plato con cereales integrales. Busque la palabra "integral" en Equities trader de la lista de ingredientes.  Llene un cuarto del plato con alimentos con protenas magras.  Disfrute de pescados grasos (como salmn, caballa, sardinas y atn) tres veces por semana.  [place] grasas saludables.  Limite los alimentos con alto contenido de  almidn y Location manager.  Consuma ms comida casera y menos de restaurante, de buf y comida rpida.  Limite el consumo de alimentos fritos.  Cocine los alimentos utilizando mtodos que no sean la fritura.  Limite el consumo de grasas saturadas.  Verifique las listas de ingredientes para evitar alimentos con aceites parcialmente hidrogenados (grasas trans).  QU ALIMENTOS PUEDO COMER? Cereales Cereales integrales, como los panes de salvado o [place], las [place], los cereales y las pastas. Avena sin endulzar, trigo, [place], quinua o arroz integral. Tortillas de harina de maz o de salvado. Vegetales Verduras frescas o congeladas (crudas, al vapor, asadas o grilladas). Ensaladas de hojas verdes. Fruits Frutas frescas, en conserva (en su jugo natural) o frutas congeladas. Carnes y otros productos con protenas Carne de res molida (al 85% o ms [place]), carne de res de animales alimentados con pastos o carne de res sin la grasa. Pollo o pavo sin piel. Carne de pollo o de [place]. Cerdo sin la grasa. Todos los pescados y frutos de mar. Huevos. Porotos, guisantes o lentejas secos. Frutos secos o semillas sin sal. Frijoles secos o en lata sin sal. Lcteos Productos lcteos con bajo contenido de grasas, como [place] o al 1%, quesos reducidos en grasas o al 2%, ricota con bajo contenido de grasas o [employer], o yogur natural con bajo contenido de [place]. Grasas y Naval architect en barra que no contengan grasas trans. [place] y condimentos para ensaladas livianos o reducidos en grasas. [place]. Aceites de crtamo, oliva o canola. [place] natural de man o  almendra. Los artculos mencionados arriba pueden no ser [employer] de las bebidas o los alimentos recomendados. Comunquese con el nutricionista para conocer ms opciones. QU ALIMENTOS NO SE RECOMIENDAN? Cereales Pan blanco. Pastas blancas. Arroz blanco. Pan de maz. Bagels, pasteles y croissants. Galletas  saladas que contengan grasas trans. Vegetales Papas blancas. Maz. Vegetales con crema o fritos. Verduras en [place]. Fruits Frutas secas. Fruta enlatada en almbar liviano o espeso. Jugo de frutas. Carnes y otros productos con protenas [place] de carne con Lobbyist. Costillas, alas de pollo, tocineta, salchicha, mortadela, salame, chinchulines, tocino, perros calientes, salchichas alemanas y embutidos envasados. Lcteos [place] entera o al 2%, crema, mezcla de [place] y crema y queso crema. Yogur entero o endulzado. Quesos con toda su grasa. [place]rem[place] no lcteas y coberturas batidas. Quesos procesados, quesos para untar o cuajadas. Dulces y postres Jarabe de maz, azcares, miel y Control and instrumentation engineer. Caramelos. Mermelada y [place]. Chrissie Noa. Cereales endulzados. Galletas, pasteles, bizcochuelos, donas, muffins y helado. Grasas y aceites [place], [place] en barra, [place] de [place], [place], [place] clarificada o grasa de tocino. Aceites de coco, de palmiste o de palma. Bebidas Alcohol. Bebidas endulzadas (como refrescos, limonadas y bebidas frutales o ponches). Los artculos mencionados arriba pueden no ser [employer] de las bebidas y los alimentos que se Higher education careers adviser. Comunquese con el nutricionista para recibir ms informacin. Esta informacin no tiene Marine scientist el consejo del mdico. Asegrese de hacerle al mdico cualquier pregunta que tenga. Document Released: 11/21/2009 Document Revised: 06/08/2013 Document Reviewed: 04/07/2013 Elsevier Interactive Patient Education  2017 [employer].

## 2017-12-04 NOTE — Progress Notes (Signed)
Subjective:    Patient ID: Timothy Rice, male    DOB: February 01, 1951, 67 y.o.   MRN: [id number]  HPI  Timothy Rice, a 67 year old male with a history of hypercholesterolemia presents to establish care.  Patient primarily speaks Spanish, utilizing video interpreter to assist with communication.  Patient states that he has been taking atorvastatin 40 mg and aspirin 81 mg daily.  He is not been following a low-fat, low carbohydrate diet.  Patient has a family history significant for hypertension, type 2 diabetes, and heart disease.  Patient states that his mother passed away from heart disease at age 22.  Patient currently denies chest pain, heart palpitations, shortness of breath, fatigue, or bilateral lower extremity edema. Patient states that he resides with family.  He is up-to-date with vaccinations.  He does not exercise routinely, but is very active.  He has not had routine eye exam or dental exam over the past several years.  Patient is up-to-date with colonoscopy, his last colonoscopy was in 2016. Patient has a history of Mnire's disease.  Associated symptoms include periodic dizziness.  Dizziness has been controlled on meclizine 25 mg 3 times per day as needed.  Patient denies hearing loss, headache, blurred vision, abnormal gait, or frequent falls Past Medical History:  Diagnosis Date  . Closed right ankle fracture 2016  . Hypercholesteremia    Social History   Socioeconomic History  . Marital status: Married    Spouse name: Not on file  . Number of children: Not on file  . Years of education: Not on file  . Highest education level: Not on file  Occupational History  . Not on file  Social Needs  . Financial resource strain: Not on file  . Food insecurity:    Worry: Not on file    Inability: Not on file  . Transportation needs:    Medical: Not on file    Non-medical: Not on file  Tobacco Use  . Smoking status: Never Smoker  . Smokeless tobacco: Never Used  Substance  and Sexual Activity  . Alcohol use: Yes    Comment: occas  . Drug use: No  . Sexual activity: Not on file  Lifestyle  . Physical activity:    Days per week: Not on file    Minutes per session: Not on file  . Stress: Not on file  Relationships  . Social connections:    Talks on phone: Not on file    Gets together: Not on file    Attends religious service: Not on file    Active member of club or organization: Not on file    Attends meetings of clubs or organizations: Not on file    Relationship status: Not on file  . Intimate partner violence:    Fear of current or ex partner: Not on file    Emotionally abused: Not on file    Physically abused: Not on file    Forced sexual activity: Not on file  Other Topics Concern  . Not on file  Social History Narrative  . Not on file   Immunization History  Administered Date(s) Administered  . Influenza,inj,Quad PF,6+ Mos 04/08/2016  . Tdap 05/26/2015     Review of Systems  Constitutional: Negative.  Negative for fatigue and fever.  HENT: Negative.   Eyes: Negative.   Respiratory: Negative.   Cardiovascular: Negative.   Gastrointestinal: Negative.   Endocrine: Negative for polydipsia, polyphagia and polyuria.  Genitourinary: Negative.   Musculoskeletal: Negative.  Neurological: Negative.   Hematological: Negative.   Psychiatric/Behavioral: Negative.        Objective:   Physical Exam  Constitutional: He is oriented to person, place, and time. He appears well-developed and well-nourished.  Eyes: Pupils are equal, round, and reactive to light.  Neck: Normal range of motion.  Cardiovascular: Normal rate, regular rhythm, normal heart sounds and intact distal pulses.  Pulmonary/Chest: Effort normal and breath sounds normal.  Abdominal: Soft. Bowel sounds are normal.  Neurological: He is alert and oriented to person, place, and time.  Skin: Skin is warm and dry.  Psychiatric: He has a normal mood and affect. His behavior is  normal. Judgment and thought content normal.       BP 131/86 (BP Location: Left Arm, Patient Position: Sitting, Cuff Size: Normal)   Pulse 67   Temp 97.9 F (36.6 C) (Oral)   Resp 14   Ht 5\' 4"  (1.626 m)   Wt 157 lb (71.2 kg)   SpO2 99%   BMI 26.95 kg/m  Assessment & Plan:  1. Hyperlipidemia, unspecified hyperlipidemia type  We will continue atorvastatin 40 mg and aspirin 81 mg.  Also recommend a low-fat, low carbohydrate diet. - HgB A1c - Lipid Panel - Comprehensive metabolic panel  2. Elevated blood-pressure reading without diagnosis of hypertension Blood pressure mildly elevated, recommend the patient return in 1 week for blood pressure check.  3. Immunization due - Pneumococcal conjugate vaccine 13-valent  4. Need for hepatitis C screening test - Hepatitis C Antibody  5. Language barrier to communication Patient primarily speaks [place], using video interpreter  6. Excessive tearing, bilateral - Ambulatory referral to Ophthalmology  7. Meniere disease, bilateral - meclizine (ANTIVERT) 25 MG tablet; Take 1 tablet (25 mg total) by mouth 3 (three) times daily as needed.  Dispense: 30 tablet; Refill: 1   RTC; 3 months for chronic conditions and 1 week for bp check   Donia Pounds  MSN, FNP-C [facility] [address] [place], [place] [id number] [phone]

## 2017-12-05 ENCOUNTER — Other Ambulatory Visit: Payer: Self-pay | Admitting: Family Medicine

## 2017-12-05 DIAGNOSIS — R799 Abnormal finding of blood chemistry, unspecified: Secondary | ICD-10-CM

## 2017-12-05 DIAGNOSIS — E785 Hyperlipidemia, unspecified: Secondary | ICD-10-CM

## 2017-12-05 DIAGNOSIS — R7989 Other specified abnormal findings of blood chemistry: Secondary | ICD-10-CM

## 2017-12-05 LAB — COMPREHENSIVE METABOLIC PANEL
A/G RATIO: 1.4 (ref 1.2–2.2)
ALT: 23 IU/L (ref 0–44)
AST: 21 IU/L (ref 0–40)
Albumin: 4.2 g/dL (ref 3.6–4.8)
Alkaline Phosphatase: 103 IU/L (ref 39–117)
BILIRUBIN TOTAL: 0.8 mg/dL (ref 0.0–1.2)
BUN/Creatinine Ratio: 7 — ABNORMAL LOW (ref 10–24)
BUN: 9 mg/dL (ref 8–27)
CALCIUM: 9.3 mg/dL (ref 8.6–10.2)
CHLORIDE: 103 mmol/L (ref 96–106)
CO2: 23 mmol/L (ref 20–29)
Creatinine, Ser: 1.32 mg/dL — ABNORMAL HIGH (ref 0.76–1.27)
GFR calc non Af Amer: 56 mL/min/{1.73_m2} — ABNORMAL LOW (ref >59)
GFR, EST AFRICAN AMERICAN: 65 mL/min/{1.73_m2} (ref >59)
GLUCOSE: 114 mg/dL — AB (ref 65–99)
Globulin, Total: 3 g/dL (ref 1.5–4.5)
POTASSIUM: 4.4 mmol/L (ref 3.5–5.2)
Sodium: 139 mmol/L (ref 134–144)
TOTAL PROTEIN: 7.2 g/dL (ref 6.0–8.5)

## 2017-12-05 LAB — HEPATITIS C ANTIBODY: Hep C Virus Ab: <0.1 s/co ratio (ref 0.0–0.9)

## 2017-12-05 LAB — LIPID PANEL
CHOL/HDL RATIO: 9.2 ratio — AB (ref 0.0–5.0)
Cholesterol, Total: 294 mg/dL — ABNORMAL HIGH (ref 100–199)
HDL: 32 mg/dL — AB (ref >39)
TRIGLYCERIDES: 626 mg/dL — AB (ref 0–149)

## 2017-12-05 MED ORDER — ATORVASTATIN CALCIUM 80 MG PO TABS: 80.0000 mg | ORAL_TABLET | Freq: Every day | ORAL | 1 refills | Status: DC

## 2017-12-05 MED ORDER — OMEGA-3-ACID ETHYL ESTERS 1 G PO CAPS: 2.0000 g | ORAL_CAPSULE | Freq: Two times a day (BID) | ORAL | 5 refills | Status: DC

## 2017-12-05 NOTE — Progress Notes (Signed)
Orders Placed This Encounter  Procedures  . Lipid panel    Standing Status:   Future    Standing Expiration Date:   12/06/2018  . Basic metabolic panel    Standing Status:   Future    Standing Expiration Date:   12/06/2018    Donia Pounds  MSN, FNP-C [facility] [address] [place], [place] [address] [phone]

## 2017-12-05 NOTE — Progress Notes (Signed)
Meds ordered this encounter  Medications  . atorvastatin (LIPITOR) 80 MG tablet    Sig: Take 1 tablet (80 mg total) by mouth daily.    Dispense:  90 tablet    Refill:  1  . omega-3 acid ethyl esters (LOVAZA) 1 g capsule    Sig: Take 2 capsules (2 g total) by mouth 2 (two) times daily.    Dispense:  60 capsule    Refill:  [place]  MSN, FNP-C [facility] [address] [place], [place] [address] [phone]

## 2017-12-08 ENCOUNTER — Telehealth: Payer: Self-pay

## 2017-12-08 NOTE — Telephone Encounter (Signed)
Called using interpreter id [phone]. No answer and left a message for patient to call back regarding lab results. Thanks!

## 2017-12-08 NOTE — Telephone Encounter (Signed)
-----   Message from Dorena Dew, [place] sent at 12/05/2017  6:07 AM EDT ----- Regarding: lab results Please inform patient that triglycerides are markedly elevated. Also, total cholesterol is elevated at 284, goal is < 200. Will increase atorvastatin to 80 mg every evening. Also, added 2 g of Lovaza daily. Recommend a lowfat, low carbohydrate diet divided over 5-6 small meals, increase water intake to 6-8 glasses, and 150 minutes per week of cardiovascular exercise.   Creatinine level is mildly elevated, will repeat in 3 months. Please schedule lab appointment. Return for blood pressure check as discussed during appointment.    Timothy Pounds  MSN, FNP-C [facility] [address] [place], [place] [address] [phone]

## 2017-12-09 ENCOUNTER — Other Ambulatory Visit: Payer: Self-pay | Admitting: Family Medicine

## 2017-12-09 ENCOUNTER — Telehealth: Payer: Self-pay | Admitting: Family Medicine

## 2017-12-09 DIAGNOSIS — E785 Hyperlipidemia, unspecified: Secondary | ICD-10-CM

## 2017-12-09 MED ORDER — OMEGA-3 FISH OIL 500 MG PO CAPS: 1.0000 | ORAL_CAPSULE | Freq: Every day | ORAL | 0 refills | Status: DC

## 2017-12-09 NOTE — Progress Notes (Signed)
Meds ordered this encounter  Medications  . Omega-3 Fatty Acids (OMEGA-3 FISH OIL) 500 MG CAPS    Sig: Take 1 capsule by mouth daily.    Dispense:  60 capsule    Refill:  0     Donia Pounds  MSN, FNP-C [facility] [address] [place], [place] [address] [phone]

## 2017-12-09 NOTE — Telephone Encounter (Signed)
Called using interpreter id [phone]. No answer and message was left for patient to call back regarding labs. Thanks!

## 2017-12-09 NOTE — Telephone Encounter (Signed)
Levada Dy states that pt has been referred to [facility] for services and provider requests information on patient's insurance coverage;no information available; states that she will follow up with patient

## 2017-12-10 NOTE — Telephone Encounter (Signed)
Called using interpretor id [phone]. Spoke with patient, advised that triglycerides and total cholesterol was elevated. Advised that we increased atorvastatin to 80mg  every evening and also are adding a fish oil otc once daily to medications. Asked that patient eat a low fat/low carb diet over 5 to 6 small meals daily, increase water intake to 6 to 8 glasses daily and exercise 150 minutes of cardio. Asked that patient keep next scheduled appointment. Thanks!

## 2017-12-11 ENCOUNTER — Ambulatory Visit: Payer: Medicaid Other

## 2017-12-11 VITALS — BP 112/78

## 2017-12-11 DIAGNOSIS — R030 Elevated blood-pressure reading, without diagnosis of hypertension: Secondary | ICD-10-CM

## 2018-02-19 ENCOUNTER — Other Ambulatory Visit: Payer: Self-pay | Admitting: Family Medicine

## 2018-02-19 DIAGNOSIS — H8103 Meniere's disease, bilateral: Secondary | ICD-10-CM

## 2018-03-06 ENCOUNTER — Ambulatory Visit: Payer: Self-pay | Admitting: Family Medicine

## 2019-02-24 ENCOUNTER — Encounter (HOSPITAL_COMMUNITY): Payer: Self-pay | Admitting: *Deleted

## 2019-02-24 ENCOUNTER — Encounter (HOSPITAL_COMMUNITY): Payer: Self-pay

## 2020-04-20 ENCOUNTER — Other Ambulatory Visit: Payer: Self-pay

## 2020-04-20 ENCOUNTER — Ambulatory Visit (HOSPITAL_COMMUNITY)
Admission: EM | Admit: 2020-04-20 | Discharge: 2020-04-20 | Disposition: A | Discharge status: Home / Self Care | Payer: Medicare Other | Attending: Internal Medicine | Admitting: Internal Medicine

## 2020-04-20 ENCOUNTER — Encounter (HOSPITAL_COMMUNITY): Payer: Self-pay

## 2020-04-20 DIAGNOSIS — S29012A Strain of muscle and tendon of back wall of thorax, initial encounter: Secondary | ICD-10-CM | POA: Diagnosis not present

## 2020-04-20 MED ORDER — TIZANIDINE HCL 4 MG PO TABS: 2.0000 mg | ORAL_TABLET | Freq: Two times a day (BID) | ORAL | 0 refills | Status: AC | PRN

## 2020-04-20 NOTE — ED Triage Notes (Signed)
Pt presents with right side flank pain X 3 months with most pain with bending over and deep breathing with no complaints of any other symptoms.

## 2020-04-20 NOTE — Discharge Instructions (Signed)
It appears you have strained a muscle in your upper back.  Avoid taking Motrin, ibuprofen or Aleve given your history of elevated kidney function.  You can take Tylenol for discomfort.  Use muscle relaxer as prescribed.  Again this medication may make you sleepy so avoid taking before driving.  Use heating pad 3-4 times daily for 15 to 20 minutes at a time.  Frequent stretching.  Return to urgent care for any worsening symptoms.

## 2020-04-20 NOTE — ED Provider Notes (Signed)
[facility]    CSN: [id number] Arrival date & time: 04/20/20  1135      History   Chief Complaint Chief Complaint  Patient presents with  . Flank Pain    HPI Timothy Rice is a 69 y.o. male with past medical history of hyperlipidemia presents to urgent care with complaints of right-sided back pain.  Patient reports symptoms ongoing x3 months and becoming increasingly worse patient states pain is persistent and worse with any lifting or sudden movement.  Patient denies any known injury or trauma. Patient denies any weakness, numbness, tingling, chest pain, shortness of breath.  Patient tried previously prescribed gabapentin for pain but has always had trouble tolerating this medication due to dizziness.  History and exam done via [place] interpreters   Past Medical History:  Diagnosis Date  . Closed right ankle fracture 2016  . Hypercholesteremia     Patient Active Problem List   Diagnosis Date Noted  . Hyperlipidemia 12/04/2017  . Abdominal wall hernia 07/25/2016    History reviewed. No pertinent surgical history.     Home Medications    Prior to Admission medications   Medication Sig Start Date End Date Taking? Authorizing Provider  aspirin EC 81 MG tablet Take 81 mg by mouth daily.    [provider]  atorvastatin (LIPITOR) 80 MG tablet Take 1 tablet (80 mg total) by mouth daily. 12/05/17   Dorena Dew, FNP  meclizine (ANTIVERT) 25 MG tablet TAKE ONE TABLET BY MOUTH THREE TIMES DAILY AS NEEDED 02/19/18   Lanae Boast, FNP  Omega-3 Fatty Acids (OMEGA-3 FISH OIL) 500 MG CAPS Take 1 capsule by mouth daily. 12/09/17   Dorena Dew, FNP  tiZANidine (ZANAFLEX) 4 MG tablet Take 0.5 tablets (2 mg total) by mouth every 12 (twelve) hours as needed for muscle spasms. 04/20/20 04/20/21  Rudolpho Sevin, NP    Family History Family History  Problem Relation Age of Onset  . Stroke Mother   . Stroke Father     Social History Social History    Tobacco Use  . Smoking status: Never Smoker  . Smokeless tobacco: Never Used  Vaping Use  . Vaping Use: Never used  Substance Use Topics  . Alcohol use: Yes    Comment: occas  . Drug use: No     Allergies   Patient has no known allergies.   Review of Systems As stated in HPI otherwise negative   Physical Exam Triage Vital Signs ED Triage Vitals  Enc Vitals Group     BP      Pulse      Resp      Temp      Temp src      SpO2      Weight      Height      Head Circumference      Peak Flow      Pain Score      Pain Loc      Pain Edu?      Excl. in [place]?    No data found.  Updated Vital Signs BP 136/83 (BP Location: Right Arm)   Pulse 82   Temp 98.3 F (36.8 C) (Oral)   Resp 16   SpO2 98%   Visual Acuity Right Eye Distance:   Left Eye Distance:   Bilateral Distance:    Right Eye Near:   Left Eye Near:    Bilateral Near:     Physical Exam Constitutional:  General: He is not in acute distress.    Appearance: Normal appearance. He is normal weight. He is not ill-appearing.  Pulmonary:     Effort: Pulmonary effort is normal.     Breath sounds: Normal breath sounds.  Abdominal:     Palpations: Abdomen is soft.     Tenderness: There is no abdominal tenderness. There is no right CVA tenderness, left CVA tenderness or guarding.  Musculoskeletal:        General: No swelling or deformity. Normal range of motion.     Cervical back: Normal range of motion and neck supple.     Comments: TTP to right latissimus muscle.  No swelling no obvious deformity  Skin:    General: Skin is warm and dry.  Neurological:     General: No focal deficit present.     Mental Status: He is alert and oriented to person, place, and time.  Psychiatric:        Mood and Affect: Mood normal.        Behavior: Behavior normal.      UC Treatments / Results  Labs (all labs ordered are listed, but only abnormal results are displayed) Labs Reviewed - No data to  display  EKG   Radiology No results found.  Procedures Procedures (including critical care time)  Medications Ordered in UC Medications - No data to display  Initial Impression / Assessment and Plan / UC Course  I have reviewed the triage vital signs and the nursing notes.  Pertinent labs & imaging results that were available during my care of the patient were reviewed by me and considered in my medical decision making (see chart for details).  Right latissimus muscle strain -Uncertain of mechanism of injury.  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 Questions answered. Outlined signs and symptoms indicating need for more acute intervention. Pt verbalized understanding. AVS given   Final Clinical Impressions(s) / UC Diagnoses   Final diagnoses:  Upper back strain, initial encounter     Discharge Instructions     It appears you have strained a muscle in your upper back.  Avoid taking Motrin, ibuprofen or Aleve given your history of elevated kidney function.  You can take Tylenol for discomfort.  Use muscle relaxer as prescribed.  Again this medication may make you sleepy so avoid taking before driving.  Use heating pad 3-4 times daily for 15 to 20 minutes at a time.  Frequent stretching.  Return to urgent care for any worsening symptoms.    ED Prescriptions    Medication Sig Dispense Auth. Provider   tiZANidine (ZANAFLEX) 4 MG tablet Take 0.5 tablets (2 mg total) by mouth every 12 (twelve) hours as needed for muscle spasms. 20 tablet Rudolpho Sevin, NP     PDMP not reviewed this encounter.   Rudolpho Sevin, NP 04/20/20 1534

## 2022-08-30 ENCOUNTER — Encounter: Payer: Self-pay | Admitting: Radiation Oncology

## 2022-08-30 NOTE — Progress Notes (Signed)
GU Location of Tumor / Histology:  Prostate Ca  If Prostate Cancer, Gleason Score is (3 + 3) and PSA is (6.1 on 06/2022)  Biopsies       Past/Anticipated interventions by urology, if any:  Dr. Delfino Lovett Puschinsky    Past/Anticipated interventions by medical oncology, if any: NA  Weight changes, if any:   No  IPSS:  11 SHIM: 17  Bowel/Bladder complaints, if any:  No  Nausea/Vomiting, if any:  No  Pain issues, if any:  5/10 abdomen left  SAFETY ISSUES: Prior radiation?  No Pacemaker/ICD?  No Possible current pregnancy? Male Is the patient on methotrexate? No  Current Complaints / other details:  Need more information about treatment options.

## 2022-09-04 ENCOUNTER — Ambulatory Visit
Admission: RE | Admit: 2022-09-04 | Discharge: 2022-09-04 | Disposition: A | Discharge status: Home / Self Care | Payer: 59 | Source: Ambulatory Visit | Attending: Radiation Oncology | Admitting: Radiation Oncology

## 2022-09-04 ENCOUNTER — Encounter: Payer: Self-pay | Admitting: Radiation Oncology

## 2022-09-04 ENCOUNTER — Other Ambulatory Visit: Payer: Self-pay

## 2022-09-04 ENCOUNTER — Telehealth: Payer: Self-pay | Admitting: *Deleted

## 2022-09-04 ENCOUNTER — Encounter: Payer: Self-pay | Admitting: Urology

## 2022-09-04 VITALS — BP 117/79 | HR 89 | Temp 97.1°F | Ht 64.0 in | Wt 159.2 lb

## 2022-09-04 DIAGNOSIS — I129 Hypertensive chronic kidney disease with stage 1 through stage 4 chronic kidney disease, or unspecified chronic kidney disease: Secondary | ICD-10-CM | POA: Diagnosis not present

## 2022-09-04 DIAGNOSIS — C61 Malignant neoplasm of prostate: Secondary | ICD-10-CM | POA: Insufficient documentation

## 2022-09-04 DIAGNOSIS — N189 Chronic kidney disease, unspecified: Secondary | ICD-10-CM | POA: Diagnosis not present

## 2022-09-04 DIAGNOSIS — K219 Gastro-esophageal reflux disease without esophagitis: Secondary | ICD-10-CM | POA: Diagnosis not present

## 2022-09-04 DIAGNOSIS — E7800 Pure hypercholesterolemia, unspecified: Secondary | ICD-10-CM | POA: Insufficient documentation

## 2022-09-04 DIAGNOSIS — E785 Hyperlipidemia, unspecified: Secondary | ICD-10-CM | POA: Insufficient documentation

## 2022-09-04 DIAGNOSIS — J45909 Unspecified asthma, uncomplicated: Secondary | ICD-10-CM | POA: Diagnosis not present

## 2022-09-04 DIAGNOSIS — Z7982 Long term (current) use of aspirin: Secondary | ICD-10-CM | POA: Insufficient documentation

## 2022-09-04 DIAGNOSIS — Z79899 Other long term (current) drug therapy: Secondary | ICD-10-CM | POA: Insufficient documentation

## 2022-09-04 DIAGNOSIS — G4733 Obstructive sleep apnea (adult) (pediatric): Secondary | ICD-10-CM | POA: Diagnosis not present

## 2022-09-04 DIAGNOSIS — Z87891 Personal history of nicotine dependence: Secondary | ICD-10-CM | POA: Diagnosis not present

## 2022-09-04 DIAGNOSIS — E782 Mixed hyperlipidemia: Secondary | ICD-10-CM | POA: Diagnosis not present

## 2022-09-04 HISTORY — DX: Elevated prostate specific antigen (PSA): R97.20

## 2022-09-04 HISTORY — DX: Gastro-esophageal reflux disease without esophagitis: K21.9

## 2022-09-04 HISTORY — DX: Occlusion and stenosis of bilateral carotid arteries: I65.23

## 2022-09-04 HISTORY — DX: Other microscopic hematuria: R31.29

## 2022-09-04 HISTORY — DX: Obstructive sleep apnea (adult) (pediatric): G47.33

## 2022-09-04 HISTORY — DX: Biliuria: R82.2

## 2022-09-04 HISTORY — DX: Dizziness and giddiness: R42

## 2022-09-04 HISTORY — DX: Hyperlipidemia, unspecified: E78.5

## 2022-09-04 HISTORY — DX: Male erectile dysfunction, unspecified: N52.9

## 2022-09-04 HISTORY — DX: Dysuria: R30.0

## 2022-09-04 HISTORY — DX: Dysphagia, pharyngeal phase: R13.13

## 2022-09-04 HISTORY — DX: Unspecified asthma, uncomplicated: J45.909

## 2022-09-04 HISTORY — DX: Arthropathy, unspecified: M12.9

## 2022-09-04 HISTORY — DX: Bladder-neck obstruction: N32.0

## 2022-09-04 HISTORY — DX: Unspecified symptoms and signs involving the genitourinary system: R39.9

## 2022-09-04 HISTORY — DX: Essential (primary) hypertension: I10

## 2022-09-04 HISTORY — DX: Proteinuria, unspecified: R80.9

## 2022-09-04 HISTORY — DX: Acetonuria: R82.4

## 2022-09-04 HISTORY — DX: Angina pectoris, unspecified: I20.9

## 2022-09-04 HISTORY — DX: Chronic kidney disease, unspecified: N18.9

## 2022-09-04 HISTORY — DX: Pure hypercholesterolemia, unspecified: E78.00

## 2022-09-04 HISTORY — DX: Palpitations: R00.2

## 2022-09-04 HISTORY — DX: Prediabetes: R73.03

## 2022-09-04 HISTORY — DX: Vitamin D deficiency, unspecified: E55.9

## 2022-09-04 HISTORY — DX: Other fatigue: R53.83

## 2022-09-04 HISTORY — DX: Pure hyperglyceridemia: E78.1

## 2022-09-04 HISTORY — DX: Ventral hernia without obstruction or gangrene: K43.9

## 2022-09-04 HISTORY — DX: Irritable bowel syndrome without diarrhea: K58.9

## 2022-09-04 HISTORY — DX: Other abnormal findings in urine: R82.998

## 2022-09-04 NOTE — Progress Notes (Signed)
Radiation Oncology         [phone] ________________________________  Initial Outpatient Consultation  Name: Timothy Rice MRN: [id number]  Date: 09/04/2022  DOB: [birth year]  [id number], Venora Maples, FNP (Inactive)  Puschinsky, Fransico Him.,*   REFERRING PHYSICIAN: Puschinsky, Fransico Him.,*  DIAGNOSIS: 72 y.o. gentleman with Stage T1c adenocarcinoma of the prostate with Gleason score of 3+3, and PSA of 6.1.    ICD-10-CM   1. Malignant neoplasm of prostate ([facility])  C61       HISTORY OF PRESENT ILLNESS: Cluster Acre is a 72 y.o. male with a diagnosis of prostate cancer. He was noted to have an elevated PSA of 6.1 in 06/2022, by his primary care provider, Lanae Boast, NP.  Accordingly, he was referred for evaluation in urology by Dr. Burnell Blanks in 06/2022,  digital rectal examination was performed at that time revealing no concerning nodules or abnormalities.  The patient proceeded to transrectal ultrasound with 12 biopsies of the prostate on 08/06/2022.  The prostate volume measured 47 cc.  Out of 12 core biopsies, 1 was positive, involving 10% of that core.  The maximum Gleason score was 3+3, and this was seen in the left base.  The patient reviewed the biopsy results with his urologist and he has kindly been referred today for discussion of potential radiation treatment options.   PREVIOUS RADIATION THERAPY: No  PAST MEDICAL HISTORY:  Past Medical History:  Diagnosis Date   Abdominal wall hernia    Angina pectoris ([facility])    Arthropathy    Asthma    Bilateral stenosis of carotid arteries greater than 50%    Bilirubinuria    Bladder outflow obstruction    Chronic renal impairment    Closed right ankle fracture 2016   Dizziness    Dysuria    ED (erectile dysfunction)    Elevated PSA    Fatigue    GERD (gastroesophageal reflux disease)    HLD (hyperlipidemia)    Hypercholesteremia    Hypercholesterolemia    Hypertension    Hypertriglyceridemia    IBS (irritable bowel  syndrome)    Ketonuria    Leukocytes in urine    Lower urinary tract symptoms (LUTS)    Microscopic hematuria    OSA (obstructive sleep apnea)    Palpitations    Pharyngeal dysphagia    Prediabetes    Proteinuria    Vertigo    Vitamin D deficiency       PAST SURGICAL HISTORY: Past Surgical History:  Procedure Laterality Date   COLONOSCOPY     FLEXIBLE BRONCHOSCOPY W/ UPPER ENDOSCOPY     PROSTATE BIOPSY      FAMILY HISTORY:  Family History  Problem Relation Age of Onset   Hypertension Mother    Heart attack Mother    Coronary artery disease Mother    Stroke Mother    Stroke Father    Coronary artery disease Father    Heart attack Father    Hypertension Sister    Hyperlipidemia Sister    Hyperlipidemia Brother    Hypertension Brother     SOCIAL HISTORY:  Social History   Socioeconomic History   Marital status: Married    Spouse name: Not on file   Number of children: Not on file   Years of education: Not on file   Highest education level: Not on file  Occupational History   Not on file  Tobacco Use   Smoking status: Former    Types: Cigarettes   Smokeless tobacco: Never  Vaping Use   Vaping Use: Never used  Substance and Sexual Activity   Alcohol use: Yes    Comment: occas   Drug use: No   Sexual activity: Not on file  Other Topics Concern   Not on file  Social History Narrative   Not on file   Social Determinants of Health   Financial Resource Strain: Not on file  Food Insecurity: Not on file  Transportation Needs: Not on file  Physical Activity: Not on file  Stress: Not on file  Social Connections: Not on file  Intimate Partner Violence: Not on file    ALLERGIES: Patient has no known allergies.  MEDICATIONS:  Current Outpatient Medications  Medication Sig Dispense Refill   albuterol (VENTOLIN HFA) 108 (90 Base) MCG/ACT inhaler Inhale into the lungs as needed for wheezing or shortness of breath.     cyclobenzaprine (FLEXERIL) 10 MG  tablet Take 10 mg by mouth 3 (three) times daily as needed for muscle spasms.     omeprazole (PRILOSEC) 40 MG capsule      penicillin v potassium (VEETID) 500 MG tablet Take 500 mg by mouth 4 (four) times daily.     REPATHA SURECLICK 240 MG/ML SOAJ Inject 1 mL into the skin every 14 (fourteen) days.     rosuvastatin (CRESTOR) 40 MG tablet      tamsulosin (FLOMAX) 0.4 MG CAPS capsule Take 0.4 mg by mouth daily.     telmisartan (MICARDIS) 40 MG tablet Take 40 mg by mouth daily.     aspirin EC 81 MG tablet Take 81 mg by mouth daily.     meclizine (ANTIVERT) 25 MG tablet TAKE ONE TABLET BY MOUTH THREE TIMES DAILY AS NEEDED 30 tablet 1   Omega-3 Fatty Acids (OMEGA-3 FISH OIL) 500 MG CAPS Take 1 capsule by mouth daily. 60 capsule 0   triamcinolone cream (KENALOG) 0.1 % APPLY A THIN LAYER TO THE AFFECTED AREA(S) BY TOPICAL ROUTE 2 TIMES PER DAY     No current facility-administered medications for this visit.    REVIEW OF SYSTEMS:  On review of systems, the patient reports that he is doing well overall. He denies any chest pain, shortness of breath, cough, fevers, chills, night sweats, unintended weight changes. He denies any bowel disturbances, and denies abdominal pain, nausea or vomiting. He denies any new musculoskeletal or joint aches or pains. His IPSS was 11, indicating moderate urinary symptoms that have improved on Flomax daily. His SHIM was 17, indicating he has mild erectile dysfunction. A complete review of systems is obtained and is otherwise negative.    PHYSICAL EXAM:  Wt Readings from Last 3 Encounters:  12/04/17 157 lb (71.2 kg)  04/08/16 149 lb (67.6 kg)  04/18/15 148 lb 9.6 oz (67.4 kg)   Temp Readings from Last 3 Encounters:  04/20/20 98.3 F (36.8 C) (Oral)  12/04/17 97.9 F (36.6 C) (Oral)  04/08/16 97.7 F (36.5 C) (Oral)   BP Readings from Last 3 Encounters:  04/20/20 136/83  12/11/17 112/78  12/04/17 131/86   Pulse Readings from Last 3 Encounters:  04/20/20 82   12/04/17 67  04/08/16 69    /10  In general this is a well appearing Latino man in no acute distress. He's alert and oriented x4 and appropriate throughout the examination. Cardiopulmonary assessment is negative for acute distress, and he exhibits normal effort.     KPS = 100  100 - Normal; no complaints; no evidence of disease. 90   - Able  to carry on normal activity; minor signs or symptoms of disease. 80   - Normal activity with effort; some signs or symptoms of disease. 27   - Cares for self; unable to carry on normal activity or to do active work. 60   - Requires occasional assistance, but is able to care for most of his personal needs. 50   - Requires considerable assistance and frequent medical care. 52   - Disabled; requires special care and assistance. 60   - Severely disabled; hospital admission is indicated although death not imminent. 45   - Very sick; hospital admission necessary; active supportive treatment necessary. 10   - Moribund; fatal processes progressing rapidly. 0     - Dead  Karnofsky DA, Abelmann Sisters, Craver LS and Burchenal [facility] [phone]) The use of the nitrogen mustards in the palliative treatment of carcinoma: with particular reference to bronchogenic carcinoma Cancer 1 634-56  LABORATORY DATA:  Lab Results  Component Value Date   WBC 4.0 04/08/2016   HGB 14.1 04/08/2016   HCT 40.7 04/08/2016   MCV 90.0 04/08/2016   PLT 218 04/08/2016   Lab Results  Component Value Date   NA 139 12/04/2017   K 4.4 12/04/2017   CL 103 12/04/2017   CO2 23 12/04/2017   Lab Results  Component Value Date   ALT 23 12/04/2017   AST 21 12/04/2017   ALKPHOS 103 12/04/2017   BILITOT 0.8 12/04/2017     RADIOGRAPHY: No results found.    IMPRESSION/PLAN: 1. 72 y.o. gentleman with Stage T1c adenocarcinoma of the prostate with Gleason Score of 3+3, and PSA of 6.1. We discussed the patient's workup and outlined the nature of prostate cancer in this setting. The patient's T  stage, Gleason's score, and PSA put him into the favorable very low risk group. Accordingly, he is eligible for a variety of potential treatment options including active surveillance, brachytherapy, 5.5 weeks of external radiation or prostatectomy. We discussed the NCCN guideline recommendation supporting active surveillance in the setting of very low risk prostate cancer and reviewed what active surveillance would entail, including close monitoring of the PSA, periodic prostate MRIs and repeat prostate biopsies every 12-18 months. He reports that he is not comfortable with active surveillance and prefers to move forward with definitive treatment. Therefore, we discussed the  available radiation techniques, and focused on the details and logistics of delivery. We discussed and outlined the risks, benefits, short and long-term effects associated with radiotherapy and compared and contrasted these with prostatectomy. He appears to have a good understanding of his disease and our treatment recommendations which are of curative intent.  He was encouraged to ask questions that were answered to his stated satisfaction.  At the conclusion of our conversation, the patient is interested in moving forward with brachytherapy and use of SpaceOAR gel to reduce rectal toxicity from radiotherapy.  We will share our discussion with Dr. Burnell Blanks and make a referral to one of our local urologists that would participate in the procedure.  Pending they are in agreement with the plan, we would then proceed with scheduling his [facility] planning appointment in the near future.  He will be contacted by Romie Jumper in our office who will be working closely with him to coordinate the urology consult visit as well as OR scheduling and pre and post procedure appointments.  We will contact the pharmaceutical rep to ensure that [place] is available at the time of procedure.  We enjoyed meeting him today  and look forward to continuing to  participate in his care.  We personally spent 60 minutes in this encounter including chart review, reviewing radiological studies, meeting face-to-face with the patient, entering orders and completing documentation.    Nicholos Johns, PA-C    Tyler Pita, MD  [facility] Direct Dial: [phone]  Fax: [phone] [email or web address]  Skype  [email or web address]

## 2022-09-04 NOTE — Telephone Encounter (Signed)
Called patient to inform of [place] visit with Dr. Abner Greenspan  on 10-02-22- arrival time, spoke with patient and he is aware of this appt.

## 2022-09-12 NOTE — Progress Notes (Signed)
Spoke with patient's daughter via telephone to introduce myself as the prostate nurse navigator and discussed my role.  Patient has decided to proceed with brachytherapy and will have a new patient consult with Dr. Abner Greenspan on 4/3.  I provided my contact information and asked his daughter to call me with questions or concerns.  Verbalized understanding.

## 2022-10-16 ENCOUNTER — Telehealth: Payer: Self-pay | Admitting: *Deleted

## 2022-10-16 NOTE — Progress Notes (Signed)
  Radiation Oncology         [phone] ________________________________  Name: Timothy Rice MRN: [id number]  Date: 10/17/2022  DOB: April 06, 1951  SIMULATION AND TREATMENT PLANNING NOTE PUBIC ARCH STUDY  [id number], Debby Bud, FNP (Inactive)  Puschinsky, Adelfa Koh.,*  DIAGNOSIS:  72 y.o. gentleman with Stage T1c adenocarcinoma of the prostate with Gleason Score of 3+3, and PSA of 6.1.   Oncology History  Malignant neoplasm of prostate ([facility])  08/06/2022 Cancer Staging   Staging form: Prostate, AJCC 8th Edition - Clinical stage from 08/06/2022: Stage I (cT1c, cN0, cM0, PSA: 6.1, Grade Group: 1) - Signed by Marcello Fennel, PA-C on 09/04/2022 Histopathologic type: Adenocarcinoma, NOS Stage prefix: Initial diagnosis Prostate specific antigen (PSA) range: Less than 10 Gleason primary pattern: 3 Gleason secondary pattern: 3 Gleason score: 6 Histologic grading system: 5 grade system Number of biopsy cores examined: 12 Number of biopsy cores positive: 1 Location of positive needle core biopsies: One side   09/04/2022 Initial Diagnosis   Malignant neoplasm of prostate ([facility]CC)       ICD-10-CM   1. Malignant neoplasm of prostate ([facility])  C61       COMPLEX SIMULATION:  The patient presented today for evaluation for possible prostate seed implant. He was brought to the radiation planning suite and placed supine on the CT couch. A 3-dimensional image study set was obtained in upload to the planning computer. There, on each axial slice, I contoured the prostate gland. Then, using three-dimensional radiation planning tools I reconstructed the prostate in view of the structures from the transperineal needle pathway to assess for possible pubic arch interference. In doing so, I did not appreciate any pubic arch interference. Also, the patient's prostate volume was estimated based on the drawn structure. The volume was 48 cc.  Given the pubic arch appearance and prostate volume, patient remains a good  candidate to proceed with prostate seed implant. Today, he freely provided informed written consent to proceed.    PLAN: The patient will undergo prostate seed implant.   ________________________________  Artist Pais. Kathrynn Running, M.D.

## 2022-10-16 NOTE — Progress Notes (Signed)
Radiation Oncology         [phone]) [phone] ________________________________  Outpatient Follow up- Pre-seed visit  Name: Timothy Rice MRN: [id number]  Date: 10/17/2022  DOB: [birth year]  WJ:XBJYNWG, Timothy Bud, FNP (Inactive)  Rice, Timothy Koh.,*   REFERRING PHYSICIAN: Puschinsky, Timothy Koh.,*  DIAGNOSIS: 72 y.o. gentleman with Stage T1c adenocarcinoma of the prostate with Gleason score of 3+3, and PSA of 6.1.     ICD-10-CM   1. Malignant neoplasm of prostate (HCC)  C61       HISTORY OF PRESENT ILLNESS: Timothy Rice is a 72 y.o. male with a diagnosis of prostate cancer. He was noted to have an elevated PSA of 6.1 in 06/2022, by his primary care provider, Timothy Gip, NP.  Accordingly, he was referred for evaluation in urology by Dr. Merry Rice in 06/2022,  digital rectal examination was performed at that time revealing no concerning nodules or abnormalities.  The patient proceeded to transrectal ultrasound with 12 biopsies of the prostate on 08/06/2022.  The prostate volume measured 47 cc.  Out of 12 core biopsies, 1 was positive, involving 10% of that core.  The maximum Gleason score was 3+3, and this was seen in the left base.   The patient reviewed the biopsy results with his urologist and was kindly referred to [place] for discussion of potential radiation treatment options. We initially met the patient on 09/04/22 and he was most interested in proceeding with brachytherapy and SpaceOAR gel placement for treatment of his disease so we made arrangements for him to meet with a local urologist, Dr. Cardell Rice on 09/18/22 and he is in agreement with the plan to proceed. He is here today for his pre-procedure imaging for planning and to answer any additional questions he may have about this treatment.   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%    Bilirubinuria    Bladder outflow obstruction    Chronic renal impairment    Closed right ankle fracture 2016   Dizziness    Dysuria    ED (erectile dysfunction)    Elevated PSA    Fatigue    GERD (gastroesophageal reflux disease)    HLD (hyperlipidemia)    Hypercholesteremia    Hypercholesterolemia    Hypertension    Hypertriglyceridemia    IBS (irritable bowel syndrome)    Ketonuria    Leukocytes in urine    Lower urinary tract symptoms (LUTS)    Microscopic hematuria    OSA (obstructive sleep apnea)    Palpitations    Pharyngeal dysphagia    Prediabetes    Proteinuria    Vertigo    Vitamin D deficiency       PAST SURGICAL HISTORY: Past Surgical History:  Procedure Laterality Date   COLONOSCOPY     FLEXIBLE BRONCHOSCOPY W/ UPPER ENDOSCOPY     PROSTATE BIOPSY      FAMILY HISTORY:  Family History  Problem Relation Age of Onset   Hypertension Mother    Heart attack Mother    Coronary artery disease Mother    Stroke Mother    Stroke Father    Coronary artery disease Father    Heart attack Father    Hypertension Sister    Hyperlipidemia Sister    Hyperlipidemia Brother    Hypertension Brother  SOCIAL HISTORY:  Social History   Socioeconomic History   Marital status: Married    Spouse name: Not on file   Number of children: Not on file   Years of education: Not on file   Highest education level: Not on file  Occupational History   Not on file  Tobacco Use   Smoking status: Former    Types: Cigarettes   Smokeless tobacco: Never  Vaping Use   Vaping Use: Never used  Substance and Sexual Activity   Alcohol use: Yes    Comment: occas   Drug use: No   Sexual activity: Yes  Other Topics Concern   Not on file  Social History Narrative   Not on file   Social Determinants of Health   Financial Resource Strain: Not on file  Food Insecurity: Not on file  Transportation Needs: Not on file  Physical Activity:  Not on file  Stress: Not on file  Social Connections: Not on file  Intimate Partner Violence: Not on file    ALLERGIES: Patient has no known allergies.  MEDICATIONS:  Current Outpatient Medications  Medication Sig Dispense Refill   albuterol (VENTOLIN HFA) 108 (90 Base) MCG/ACT inhaler Inhale into the lungs as needed for wheezing or shortness of breath.     atorvastatin (LIPITOR) 40 MG tablet Take 40 mg by mouth daily.     hydrochlorothiazide (HYDRODIURIL) 25 MG tablet TAKE ONE TABLET o EVERY DAY     meclizine (ANTIVERT) 25 MG tablet TAKE ONE TABLET BY MOUTH THREE TIMES DAILY AS NEEDED 30 tablet 1   Omega-3 Fatty Acids (OMEGA-3 FISH OIL) 500 MG CAPS Take 1 capsule by mouth daily. 60 capsule 0   omeprazole (PRILOSEC) 40 MG capsule      REPATHA SURECLICK 140 MG/ML SOAJ Inject 1 mL into the skin every 14 (fourteen) days.     tamsulosin (FLOMAX) 0.4 MG CAPS capsule Take 0.4 mg by mouth daily.     telmisartan (MICARDIS) 40 MG tablet Take 40 mg by mouth daily.     triamcinolone cream (KENALOG) 0.1 % APPLY A THIN LAYER TO THE AFFECTED AREA(S) BY TOPICAL ROUTE 2 TIMES PER DAY     No current facility-administered medications for this visit.    REVIEW OF SYSTEMS:  On review of systems, the patient reports that he is doing well overall. He denies any chest pain, shortness of breath, cough, fevers, chills, night sweats, unintended weight changes. He denies any bowel disturbances, and denies abdominal pain, nausea or vomiting. He denies any new musculoskeletal or joint aches or pains. His IPSS was 11, indicating moderate urinary symptoms that have improved on Flomax daily. His SHIM was 17, indicating he has mild erectile dysfunction. A complete review of systems is obtained and is otherwise negative.     PHYSICAL EXAM:  Wt Readings from Last 3 Encounters:  09/04/22 159 lb 4 oz (72.2 kg)  12/04/17 157 lb (71.2 kg)  04/08/16 149 lb (67.6 kg)   Temp Readings from Last 3 Encounters:  09/04/22 (!)  97.1 F (36.2 C) (Temporal)  04/20/20 98.3 F (36.8 C) (Oral)  12/04/17 97.9 F (36.6 C) (Oral)   BP Readings from Last 3 Encounters:  09/04/22 117/79  04/20/20 136/83  12/11/17 112/78   Pulse Readings from Last 3 Encounters:  09/04/22 89  04/20/20 82  12/04/17 67    /10  In general this is a well appearing Latino male in no acute distress. He's alert and oriented x4 and appropriate throughout the  examination. Cardiopulmonary assessment is negative for acute distress, and he exhibits normal effort.     KPS = 100  100 - Normal; no complaints; no evidence of disease. 90   - Able to carry on normal activity; minor signs or symptoms of disease. 80   - Normal activity with effort; some signs or symptoms of disease. 42   - Cares for self; unable to carry on normal activity or to do active work. 60   - Requires occasional assistance, but is able to care for most of his personal needs. 50   - Requires considerable assistance and frequent medical care. 40   - Disabled; requires special care and assistance. 30   - Severely disabled; hospital admission is indicated although death not imminent. 20   - Very sick; hospital admission necessary; active supportive treatment necessary. 10   - Moribund; fatal processes progressing rapidly. 0     - Dead  Karnofsky DA, Abelmann WH, Craver LS and Burchenal [facility] [phone]) The use of the nitrogen mustards in the palliative treatment of carcinoma: with particular reference to bronchogenic carcinoma Cancer 1 634-56  LABORATORY DATA:  Lab Results  Component Value Date   WBC 4.0 04/08/2016   HGB 14.1 04/08/2016   HCT 40.7 04/08/2016   MCV 90.0 04/08/2016   PLT 218 04/08/2016   Lab Results  Component Value Date   NA 139 12/04/2017   K 4.4 12/04/2017   CL 103 12/04/2017   CO2 23 12/04/2017   Lab Results  Component Value Date   ALT 23 12/04/2017   AST 21 12/04/2017   ALKPHOS 103 12/04/2017   BILITOT 0.8 12/04/2017     RADIOGRAPHY: No  results found.    IMPRESSION/PLAN: 1. 72 y.o. gentleman with Stage T1c adenocarcinoma of the prostate with Gleason score of 3+3, and PSA of 6.1.  The patient has elected to proceed with seed implant for treatment of his disease. We reviewed the risks, benefits, short and long-term effects associated with brachytherapy and discussed the role of SpaceOAR in reducing the rectal toxicity associated with radiotherapy.  He appears to have a good understanding of his disease and our treatment recommendations which are of curative intent.  He was encouraged to ask questions that were answered to his stated satisfaction. He has freely signed written consent to proceed today in the office and a copy of this document will be placed in his medical record.  We are still awaiting a definitive procedure date, in collaboration with Dr. Cardell Rice and we will call him as soon as we have this confirmed with [facility] and will see him back for his post-procedure visit approximately 3 weeks thereafter. We look forward to continuing to participate in his care. He knows that he is welcome to call with any questions or concerns at any time in the interim.  I personally spent 30 minutes in this encounter including chart review, reviewing radiological studies, meeting face-to-face with the patient, entering orders and completing documentation.    Marguarite Arbour, MMS, PA-C [place]  [facility] Radiation Oncology Physician Assistant Direct Dial: [phone]  Fax: [phone]

## 2022-10-16 NOTE — Telephone Encounter (Signed)
CALLED THE INTREP. (JULIE) TO HAVE HER CALL THIS PATIENT TO REMIND OF PRE-SEED APPTS. FOR 10-17-22, SPOKE WITH  JULIE AND SHE WILL DO THIS

## 2022-10-17 ENCOUNTER — Other Ambulatory Visit (HOSPITAL_COMMUNITY): Payer: Self-pay | Admitting: Urology

## 2022-10-17 ENCOUNTER — Ambulatory Visit
Admission: RE | Admit: 2022-10-17 | Discharge: 2022-10-17 | Disposition: A | Discharge status: Home / Self Care | Payer: 59 | Source: Ambulatory Visit | Attending: Urology | Admitting: Urology

## 2022-10-17 ENCOUNTER — Ambulatory Visit
Admission: RE | Admit: 2022-10-17 | Discharge: 2022-10-17 | Disposition: A | Discharge status: Home / Self Care | Payer: 59 | Source: Ambulatory Visit | Attending: Radiation Oncology | Admitting: Radiation Oncology

## 2022-10-17 ENCOUNTER — Encounter: Payer: Self-pay | Admitting: Urology

## 2022-10-17 ENCOUNTER — Other Ambulatory Visit: Payer: Self-pay | Admitting: Urology

## 2022-10-17 VITALS — Resp 19 | Ht 64.0 in | Wt 161.0 lb

## 2022-10-17 DIAGNOSIS — C61 Malignant neoplasm of prostate: Secondary | ICD-10-CM

## 2022-10-17 NOTE — Progress Notes (Addendum)
Pre-seed nursing interview for a diagnosis of 72 y.o. gentleman with Stage T1c adenocarcinoma of the prostate with Gleason score of 3+3, and PSA of 6.1.  Patient identity verified x2. Patient reports mild groin discomfort 4/10. No other issues conveyed at this time.  Meaningful use complete. Urinary Management medication(s)- Tamsulosin Urology appointment date- None currently, with Dr. Merry Lofty  at [facility]  Resp 19   Ht 5\' 4"  (1.626 m)   Wt 161 lb (73 kg)   BMI 27.64 kg/m   This concludes the interview.   Ruel Favors, LPN

## 2022-11-01 ENCOUNTER — Encounter (HOSPITAL_BASED_OUTPATIENT_CLINIC_OR_DEPARTMENT_OTHER): Payer: Self-pay | Admitting: Urology

## 2022-11-08 NOTE — Progress Notes (Signed)
Left message with zona, have attempted contact with patient x 7 times, also left message with patient daughter to call nurse back and pt daughter has not called back to this nurse.

## 2022-11-13 ENCOUNTER — Encounter (HOSPITAL_BASED_OUTPATIENT_CLINIC_OR_DEPARTMENT_OTHER): Payer: Self-pay | Admitting: Urology

## 2022-11-13 ENCOUNTER — Other Ambulatory Visit: Payer: Self-pay

## 2022-11-13 NOTE — Progress Notes (Addendum)
Spoke w/ via phone for pre-op interview---pt with pac[place] interpreter number [phone] Lab needs dos----    EKG and I stat           Lab results------none COVID test -----patient states asymptomatic no test needed Arrive at -------1115 am 11-18-2022 NPO after MN NO Solid Food.  Clear liquids from MN until---1015 am Med rec completed Medications to take morning of surgery -----omeprazole Diabetic medication -----n/a Patient instructed no nail polish to be worn day of surgery Patient instructed to bring photo id and insurance card day of surgery Patient aware to have Driver (ride ) / caregiver  wife and daughter anyely   for 24 hours after surgery  Patient Special Instructions -----none Pre-Op special Instructions -----none Patient verbalized understanding of instructions that were given at this phone interview. Patient denies shortness of breath, chest pain, fever, cough at this phone interview.  Spanish interpreter requested for [place], email on chart.

## 2022-11-14 ENCOUNTER — Encounter (HOSPITAL_BASED_OUTPATIENT_CLINIC_OR_DEPARTMENT_OTHER): Payer: Self-pay | Admitting: Urology

## 2022-11-14 ENCOUNTER — Ambulatory Visit: Payer: 59 | Admitting: Radiation Oncology

## 2022-11-15 ENCOUNTER — Ambulatory Visit: Payer: 59 | Admitting: Radiation Oncology

## 2022-11-15 ENCOUNTER — Telehealth: Payer: Self-pay | Admitting: *Deleted

## 2022-11-15 NOTE — Telephone Encounter (Signed)
CALLED PATIENT TO REMIND OF PROCEDURE FOR 11-18-22, SPOKE WITH PATIENT AND HE IS AWARE OF THIS PROCEDURE

## 2022-11-18 ENCOUNTER — Ambulatory Visit (HOSPITAL_COMMUNITY): Payer: 59

## 2022-11-18 ENCOUNTER — Ambulatory Visit (HOSPITAL_BASED_OUTPATIENT_CLINIC_OR_DEPARTMENT_OTHER): Payer: 59 | Admitting: Anesthesiology

## 2022-11-18 ENCOUNTER — Encounter (HOSPITAL_BASED_OUTPATIENT_CLINIC_OR_DEPARTMENT_OTHER)
Admission: RE | Disposition: A | Discharge status: Home / Self Care | Payer: Self-pay | Source: Ambulatory Visit | Attending: Urology

## 2022-11-18 ENCOUNTER — Encounter (HOSPITAL_BASED_OUTPATIENT_CLINIC_OR_DEPARTMENT_OTHER): Payer: Self-pay | Admitting: Urology

## 2022-11-18 ENCOUNTER — Other Ambulatory Visit: Payer: Self-pay

## 2022-11-18 ENCOUNTER — Ambulatory Visit (HOSPITAL_BASED_OUTPATIENT_CLINIC_OR_DEPARTMENT_OTHER)
Admission: RE | Admit: 2022-11-18 | Discharge: 2022-11-18 | Disposition: A | Discharge status: Home / Self Care | Payer: 59 | Source: Ambulatory Visit | Attending: Urology | Admitting: Urology

## 2022-11-18 DIAGNOSIS — G4730 Sleep apnea, unspecified: Secondary | ICD-10-CM | POA: Diagnosis not present

## 2022-11-18 DIAGNOSIS — I10 Essential (primary) hypertension: Secondary | ICD-10-CM | POA: Diagnosis not present

## 2022-11-18 DIAGNOSIS — I129 Hypertensive chronic kidney disease with stage 1 through stage 4 chronic kidney disease, or unspecified chronic kidney disease: Secondary | ICD-10-CM | POA: Insufficient documentation

## 2022-11-18 DIAGNOSIS — Z01818 Encounter for other preprocedural examination: Secondary | ICD-10-CM

## 2022-11-18 DIAGNOSIS — I6523 Occlusion and stenosis of bilateral carotid arteries: Secondary | ICD-10-CM | POA: Insufficient documentation

## 2022-11-18 DIAGNOSIS — G4733 Obstructive sleep apnea (adult) (pediatric): Secondary | ICD-10-CM | POA: Diagnosis not present

## 2022-11-18 DIAGNOSIS — R7303 Prediabetes: Secondary | ICD-10-CM | POA: Insufficient documentation

## 2022-11-18 DIAGNOSIS — Z87891 Personal history of nicotine dependence: Secondary | ICD-10-CM

## 2022-11-18 DIAGNOSIS — K219 Gastro-esophageal reflux disease without esophagitis: Secondary | ICD-10-CM | POA: Diagnosis not present

## 2022-11-18 DIAGNOSIS — E785 Hyperlipidemia, unspecified: Secondary | ICD-10-CM | POA: Diagnosis not present

## 2022-11-18 DIAGNOSIS — C61 Malignant neoplasm of prostate: Secondary | ICD-10-CM | POA: Diagnosis present

## 2022-11-18 DIAGNOSIS — K589 Irritable bowel syndrome without diarrhea: Secondary | ICD-10-CM | POA: Diagnosis not present

## 2022-11-18 DIAGNOSIS — N1831 Chronic kidney disease, stage 3a: Secondary | ICD-10-CM | POA: Diagnosis not present

## 2022-11-18 DIAGNOSIS — Z79899 Other long term (current) drug therapy: Secondary | ICD-10-CM | POA: Diagnosis not present

## 2022-11-18 DIAGNOSIS — N400 Enlarged prostate without lower urinary tract symptoms: Secondary | ICD-10-CM | POA: Diagnosis not present

## 2022-11-18 HISTORY — PX: SPACE OAR INSTILLATION: SHX6769

## 2022-11-18 HISTORY — DX: Hyperlipidemia, unspecified: E78.5

## 2022-11-18 HISTORY — DX: Other complications of anesthesia, initial encounter: T88.59XA

## 2022-11-18 HISTORY — PX: RADIOACTIVE SEED IMPLANT: SHX5150

## 2022-11-18 HISTORY — PX: TRANSRECTAL ULTRASOUND: SHX5146

## 2022-11-18 HISTORY — DX: Malignant (primary) neoplasm, unspecified: C80.1

## 2022-11-18 HISTORY — DX: Meniere's disease, unspecified ear: H81.09

## 2022-11-18 HISTORY — PX: CYSTOSCOPY: SHX5120

## 2022-11-18 HISTORY — DX: Chronic kidney disease, unspecified: N18.9

## 2022-11-18 SURGERY — INSERTION, RADIATION SOURCE, PROSTATE
Anesthesia: General | Site: Rectum

## 2022-11-18 SURGERY — Surgical Case
Anesthesia: *Unknown

## 2022-11-18 MED ORDER — ROCURONIUM BROMIDE 10 MG/ML (PF) SYRINGE
PREFILLED_SYRINGE | INTRAVENOUS | Status: DC | PRN
  Administered 2022-11-18: 5 mg via INTRAVENOUS
  Administered 2022-11-18: 50 mg via INTRAVENOUS

## 2022-11-18 MED ORDER — CIPROFLOXACIN IN D5W 400 MG/200ML IV SOLN
400.0000 mg | INTRAVENOUS | Status: AC
  Administered 2022-11-18: 400 mg via INTRAVENOUS

## 2022-11-18 MED ORDER — MEPERIDINE HCL 25 MG/ML IJ SOLN: 6.2500 mg | INTRAMUSCULAR | Status: DC | PRN

## 2022-11-18 MED ORDER — ACETAMINOPHEN 500 MG PO TABS
1000.0000 mg | ORAL_TABLET | Freq: Once | ORAL | Status: AC
  Administered 2022-11-18: 1000 mg via ORAL

## 2022-11-18 MED ORDER — DOCUSATE SODIUM 100 MG PO CAPS: 100.0000 mg | ORAL_CAPSULE | Freq: Every day | ORAL | 0 refills | Status: DC | PRN

## 2022-11-18 MED ORDER — SODIUM CHLORIDE 0.9 % IV SOLN: INTRAVENOUS | Status: DC

## 2022-11-18 MED ORDER — CELECOXIB 200 MG PO CAPS
200.0000 mg | ORAL_CAPSULE | Freq: Once | ORAL | Status: AC
  Administered 2022-11-18: 200 mg via ORAL

## 2022-11-18 MED ORDER — SUGAMMADEX SODIUM 200 MG/2ML IV SOLN
INTRAVENOUS | Status: DC | PRN
  Administered 2022-11-18: 200 mg via INTRAVENOUS

## 2022-11-18 MED ORDER — PHENYLEPHRINE 80 MCG/ML (10ML) SYRINGE FOR IV PUSH (FOR BLOOD PRESSURE SUPPORT)
PREFILLED_SYRINGE | INTRAVENOUS | Status: AC
  Filled 2022-11-18: qty 10

## 2022-11-18 MED ORDER — FENTANYL CITRATE (PF) 100 MCG/2ML IJ SOLN
INTRAMUSCULAR | Status: AC
  Filled 2022-11-18: qty 2

## 2022-11-18 MED ORDER — FENTANYL CITRATE (PF) 100 MCG/2ML IJ SOLN
INTRAMUSCULAR | Status: DC | PRN
  Administered 2022-11-18 (×2): 25 ug via INTRAVENOUS
  Administered 2022-11-18: 50 ug via INTRAVENOUS

## 2022-11-18 MED ORDER — ACETAMINOPHEN 160 MG/5ML PO SOLN: 325.0000 mg | ORAL | Status: DC | PRN

## 2022-11-18 MED ORDER — SODIUM CHLORIDE 0.9 % IR SOLN
Status: DC | PRN
  Administered 2022-11-18: 1000 mL

## 2022-11-18 MED ORDER — ONDANSETRON HCL 4 MG/2ML IJ SOLN
INTRAMUSCULAR | Status: DC | PRN
  Administered 2022-11-18: 4 mg via INTRAVENOUS

## 2022-11-18 MED ORDER — STERILE WATER FOR IRRIGATION IR SOLN
Status: DC | PRN
  Administered 2022-11-18: 500 mL

## 2022-11-18 MED ORDER — DEXAMETHASONE SODIUM PHOSPHATE 10 MG/ML IJ SOLN
INTRAMUSCULAR | Status: AC
  Filled 2022-11-18: qty 1

## 2022-11-18 MED ORDER — PHENYLEPHRINE HCL-NACL 20-0.9 MG/250ML-% IV SOLN
INTRAVENOUS | Status: DC | PRN
  Administered 2022-11-18: 40 ug/min via INTRAVENOUS

## 2022-11-18 MED ORDER — ONDANSETRON HCL 4 MG/2ML IJ SOLN: 4.0000 mg | Freq: Once | INTRAMUSCULAR | Status: DC | PRN

## 2022-11-18 MED ORDER — EPHEDRINE 5 MG/ML INJ
INTRAVENOUS | Status: AC
  Filled 2022-11-18: qty 5

## 2022-11-18 MED ORDER — ACETAMINOPHEN 500 MG PO TABS
ORAL_TABLET | ORAL | Status: AC
  Filled 2022-11-18: qty 2

## 2022-11-18 MED ORDER — CIPROFLOXACIN IN D5W 400 MG/200ML IV SOLN
INTRAVENOUS | Status: AC
  Filled 2022-11-18: qty 200

## 2022-11-18 MED ORDER — IOHEXOL 300 MG/ML  SOLN
INTRAMUSCULAR | Status: DC | PRN
  Administered 2022-11-18: 7 mL via URETHRAL

## 2022-11-18 MED ORDER — ONDANSETRON HCL 4 MG/2ML IJ SOLN
INTRAMUSCULAR | Status: AC
  Filled 2022-11-18: qty 2

## 2022-11-18 MED ORDER — CELECOXIB 200 MG PO CAPS
ORAL_CAPSULE | ORAL | Status: AC
  Filled 2022-11-18: qty 1

## 2022-11-18 MED ORDER — ROCURONIUM BROMIDE 10 MG/ML (PF) SYRINGE
PREFILLED_SYRINGE | INTRAVENOUS | Status: AC
  Filled 2022-11-18: qty 10

## 2022-11-18 MED ORDER — OXYCODONE HCL 5 MG/5ML PO SOLN: 5.0000 mg | Freq: Once | ORAL | Status: DC | PRN

## 2022-11-18 MED ORDER — FENTANYL CITRATE (PF) 100 MCG/2ML IJ SOLN
25.0000 ug | INTRAMUSCULAR | Status: DC | PRN
  Administered 2022-11-18: 25 ug via INTRAVENOUS

## 2022-11-18 MED ORDER — ACETAMINOPHEN 325 MG PO TABS: 325.0000 mg | ORAL_TABLET | ORAL | Status: DC | PRN

## 2022-11-18 MED ORDER — SODIUM CHLORIDE (PF) 0.9 % IJ SOLN
INTRAMUSCULAR | Status: DC | PRN
  Administered 2022-11-18: 10 mL via INTRAVENOUS

## 2022-11-18 MED ORDER — PHENYLEPHRINE 80 MCG/ML (10ML) SYRINGE FOR IV PUSH (FOR BLOOD PRESSURE SUPPORT)
PREFILLED_SYRINGE | INTRAVENOUS | Status: DC | PRN
  Administered 2022-11-18: 160 ug via INTRAVENOUS
  Administered 2022-11-18: 80 ug via INTRAVENOUS
  Administered 2022-11-18: 160 ug via INTRAVENOUS
  Administered 2022-11-18: 80 ug via INTRAVENOUS
  Administered 2022-11-18: 240 ug via INTRAVENOUS

## 2022-11-18 MED ORDER — FLEET ENEMA 7-19 GM/118ML RE ENEM: 1.0000 | ENEMA | Freq: Once | RECTAL | Status: DC

## 2022-11-18 MED ORDER — DEXAMETHASONE SODIUM PHOSPHATE 10 MG/ML IJ SOLN
INTRAMUSCULAR | Status: DC | PRN
  Administered 2022-11-18: 10 mg via INTRAVENOUS

## 2022-11-18 MED ORDER — EPHEDRINE SULFATE-NACL 50-0.9 MG/10ML-% IV SOSY
PREFILLED_SYRINGE | INTRAVENOUS | Status: DC | PRN
  Administered 2022-11-18: 5 mg via INTRAVENOUS

## 2022-11-18 MED ORDER — OXYCODONE-ACETAMINOPHEN 5-325 MG PO TABS: 1.0000 | ORAL_TABLET | ORAL | 0 refills | Status: DC | PRN

## 2022-11-18 MED ORDER — PROPOFOL 10 MG/ML IV BOLUS
INTRAVENOUS | Status: DC | PRN
  Administered 2022-11-18: 150 mg via INTRAVENOUS
  Administered 2022-11-18: 20 mg via INTRAVENOUS

## 2022-11-18 MED ORDER — LIDOCAINE 2% (20 MG/ML) 5 ML SYRINGE
INTRAMUSCULAR | Status: DC | PRN
  Administered 2022-11-18: 100 mg via INTRAVENOUS

## 2022-11-18 MED ORDER — OXYCODONE HCL 5 MG PO TABS: 5.0000 mg | ORAL_TABLET | Freq: Once | ORAL | Status: DC | PRN

## 2022-11-18 MED ORDER — LIDOCAINE HCL (PF) 2 % IJ SOLN
INTRAMUSCULAR | Status: AC
  Filled 2022-11-18: qty 10

## 2022-11-18 SURGICAL SUPPLY — 44 items
BAG DRN RND TRDRP ANRFLXCHMBR (UROLOGICAL SUPPLIES) ×3
BAG URINE DRAIN 2000ML AR STRL (UROLOGICAL SUPPLIES) ×3 IMPLANT
BLADE CLIPPER SENSICLIP SURGIC (BLADE) ×3 IMPLANT
CATH FOLEY 2WAY SLVR 5CC 16FR (CATHETERS) ×3 IMPLANT
CATH ROBINSON RED A/P 16FR (CATHETERS) IMPLANT
CATH ROBINSON RED A/P 20FR (CATHETERS) ×3 IMPLANT
CLOTH BEACON ORANGE TIMEOUT ST (SAFETY) ×3 IMPLANT
CNTNR URN SCR LID CUP LEK RST (MISCELLANEOUS) ×3 IMPLANT
CONT SPEC 4OZ STRL OR WHT (MISCELLANEOUS) ×3
COVER BACK TABLE 60X90IN (DRAPES) ×3 IMPLANT
COVER MAYO STAND STRL (DRAPES) ×3 IMPLANT
DRSG TEGADERM 4X4.75 (GAUZE/BANDAGES/DRESSINGS) ×3 IMPLANT
DRSG TEGADERM 8X12 (GAUZE/BANDAGES/DRESSINGS) ×3 IMPLANT
GEL ULTRASOUND 20GR AQUASONIC (MISCELLANEOUS) ×6 IMPLANT
GLOVE BIO SURGEON STRL SZ 6.5 (GLOVE) IMPLANT
GLOVE BIO SURGEON STRL SZ7 (GLOVE) ×3 IMPLANT
GLOVE BIO SURGEON STRL SZ7.5 (GLOVE) ×3 IMPLANT
GLOVE BIO SURGEON STRL SZ8 (GLOVE) IMPLANT
GLOVE BIOGEL PI IND STRL 6.5 (GLOVE) IMPLANT
GLOVE SURG ORTHO 8.5 STRL (GLOVE) ×3 IMPLANT
GOWN STRL REUS W/TWL LRG LVL3 (GOWN DISPOSABLE) ×3 IMPLANT
GRID BRACH TEMP 18GA 2.8X3X.75 (MISCELLANEOUS) ×3 IMPLANT
HOLDER FOLEY CATH W/STRAP (MISCELLANEOUS) IMPLANT
I-125 seeds IMPLANT
IMPL SPACEOAR VUE SYSTEM (Spacer) ×3 IMPLANT
IMPLANT SPACEOAR VUE SYSTEM (Spacer) ×3 IMPLANT
IV NS 1000ML (IV SOLUTION) ×3
IV NS 1000ML BAXH (IV SOLUTION) ×3 IMPLANT
KIT TURNOVER CYSTO (KITS) ×3 IMPLANT
NDL BRACHY 18G 5PK (NEEDLE) ×12 IMPLANT
NDL BRACHY 18G SINGLE (NEEDLE) IMPLANT
NDL PK MORGANSTERN STABILIZ (NEEDLE) ×3 IMPLANT
NEEDLE BRACHY 18G 5PK (NEEDLE) ×12 IMPLANT
NEEDLE BRACHY 18G SINGLE (NEEDLE) IMPLANT
NEEDLE PK MORGANSTERN STABILIZ (NEEDLE) ×3 IMPLANT
PACK CYSTO (CUSTOM PROCEDURE TRAY) ×3 IMPLANT
SHEATH ULTRASOUND LF (SHEATH) IMPLANT
SHEATH ULTRASOUND LTX NONSTRL (SHEATH) IMPLANT
SLEEVE SCD COMPRESS KNEE MED (STOCKING) ×3 IMPLANT
SUT BONE WAX W31G (SUTURE) IMPLANT
SYR 10ML LL (SYRINGE) ×3 IMPLANT
TOWEL OR 17X24 6PK STRL BLUE (TOWEL DISPOSABLE) ×3 IMPLANT
UNDERPAD 30X36 HEAVY ABSORB (UNDERPADS AND DIAPERS) ×6 IMPLANT
WATER STERILE IRR 500ML POUR (IV SOLUTION) ×3 IMPLANT

## 2022-11-18 NOTE — Transfer of Care (Signed)
Immediate Anesthesia Transfer of Care Note  Patient: Timothy Rice  Procedure(s) Performed: RADIOACTIVE SEED IMPLANT/BRACHYTHERAPY IMPLANT (Prostate) CYSTOSCOPY (Bladder) TRANSRECTAL ULTRASOUND (Rectum) SPACE OAR INSTILLATION (Prostate)  Patient Location: PACU  Anesthesia Type:General  Level of Consciousness: awake, alert , oriented, and patient cooperative  Airway & Oxygen Therapy: Patient Spontanous Breathing  Post-op Assessment: Report given to RN and Post -op Vital signs reviewed and stable  Post vital signs: Reviewed and stable  Last Vitals:  Vitals Value Taken Time  BP 117/78 11/18/22 1425  Temp    Pulse 80 11/18/22 1426  Resp 14 11/18/22 1427  SpO2 93 % 11/18/22 1426  Vitals shown include unvalidated device data.  Last Pain:  Vitals:   11/18/22 1146  TempSrc: Oral  PainSc: 0-No pain      Patients Stated Pain Goal: 3 (11/18/22 1146)  Complications: No notable events documented.

## 2022-11-18 NOTE — Anesthesia Postprocedure Evaluation (Signed)
Anesthesia Post Note  Patient: Timothy Rice  Procedure(s) Performed: RADIOACTIVE SEED IMPLANT/BRACHYTHERAPY IMPLANT (Prostate) CYSTOSCOPY (Bladder) TRANSRECTAL ULTRASOUND (Rectum) SPACE OAR INSTILLATION (Prostate)     Patient location during evaluation: PACU Anesthesia Type: General Level of consciousness: awake Pain management: pain level controlled Vital Signs Assessment: post-procedure vital signs reviewed and stable Respiratory status: spontaneous breathing, nonlabored ventilation and respiratory function stable Cardiovascular status: blood pressure returned to baseline and stable Postop Assessment: no apparent nausea or vomiting Anesthetic complications: no  No notable events documented.  Last Vitals:  Vitals:   11/18/22 1500 11/18/22 1550  BP: 110/82 127/81  Pulse: 81 74  Resp: 12 16  Temp:  36.4 C  SpO2: 97% 100%    Last Pain:  Vitals:   11/18/22 1550  TempSrc:   PainSc: 3                  Linton Rump

## 2022-11-18 NOTE — Progress Notes (Signed)
  Radiation Oncology         [id number]) [phone] ________________________________  Name: Doss Mccormack MRN: [id number]  Date: 11/18/2022  DOB: 1951/01/08       Prostate Seed Implant  WJ:XBJYNWG, Debby Bud, FNP (Inactive)  No ref. provider found  DIAGNOSIS:   72 y.o. gentleman with Stage T1c adenocarcinoma of the prostate with Gleason Score of 3+3, and PSA of 6.1.   Oncology History  Malignant neoplasm of prostate (HCC)  08/06/2022 Cancer Staging   Staging form: Prostate, AJCC 8th Edition - Clinical stage from 08/06/2022: Stage I (cT1c, cN0, cM0, PSA: 6.1, Grade Group: 1) - Signed by Marcello Fennel, PA-C on 09/04/2022 Histopathologic type: Adenocarcinoma, NOS Stage prefix: Initial diagnosis Prostate specific antigen (PSA) range: Less than 10 Gleason primary pattern: 3 Gleason secondary pattern: 3 Gleason score: 6 Histologic grading system: 5 grade system Number of biopsy cores examined: 12 Number of biopsy cores positive: 1 Location of positive needle core biopsies: One side   09/04/2022 Initial Diagnosis   Malignant neoplasm of prostate (HCC)       ICD-10-CM   1. Pre-op testing  Z01.818 CBG per Guidelines for Diabetes Management for Patients Undergoing Surgery ([facility], [facility], and [facility] only)    CBG per protocol    I-Stat, Chem 8 on day of surgery per protocol    EKG 12 lead per protocol      PROCEDURE: Insertion of radioactive I-125 seeds into the prostate gland.  RADIATION DOSE: 145 Gy, definitive therapy.  TECHNIQUE: Haney Simonet was brought to the operating room with the urologist. He was placed in the dorsolithotomy position. He was catheterized and a rectal tube was inserted. The perineum was shaved, prepped and draped. The ultrasound probe was then introduced by me into the rectum to see the prostate gland.  TREATMENT DEVICE: I attached the needle grid to the ultrasound probe stand and anchor needles were placed.  3D PLANNING: The prostate was imaged in 3D using a sagittal sweep  of the prostate probe. These images were transferred to the planning computer. There, the prostate, urethra and rectum were defined on each axial reconstructed image. Then, the software created an optimized 3D plan and a few seed positions were adjusted. The quality of the plan was reviewed using [facility] information for the target and the following two organs at risk:  Urethra and Rectum.  Then the accepted plan was printed and handed off to the radiation therapist.  Under my supervision, the custom loading of the seeds and spacers was carried out using the quick loader.  These pre-loaded needles were then placed into the needle holder.Marland Kitchen  PROSTATE VOLUME STUDY:  Using transrectal ultrasound the volume of the prostate was verified to be 60 cc.  SPECIAL TREATMENT PROCEDURE/SUPERVISION AND HANDLING: The pre-loaded needles were then delivered by the urologist under sagittal guidance. A total of 18 needles were used to deposit 75 seeds in the prostate gland. The individual seed activity was 0.525 mCi.  SpaceOAR:  Yes  COMPLEX SIMULATION: At the end of the procedure, an anterior radiograph of the pelvis was obtained to document seed positioning and count. Cystoscopy was performed by the urologist to check the urethra and bladder.  MICRODOSIMETRY: At the end of the procedure, the patient was emitting 0.09 mR/hr at 1 meter. Accordingly, he was considered safe for hospital discharge.  PLAN: The patient will return to the radiation oncology clinic for post implant CT dosimetry in three weeks.   ________________________________  Artist Pais Kathrynn Running, M.D.

## 2022-11-18 NOTE — Anesthesia Preprocedure Evaluation (Addendum)
Anesthesia Evaluation  Patient identified by MRN, date of birth, ID band Patient awake    Reviewed: Allergy & Precautions, H&P , NPO status , Patient's Chart, lab work & pertinent test results  History of Anesthesia Complications (+) PROLONGED EMERGENCE and history of anesthetic complications  Airway Mallampati: II  TM Distance: >3 FB Neck ROM: Full    Dental no notable dental hx.    Pulmonary sleep apnea , former smoker   Pulmonary exam normal breath sounds clear to auscultation       Cardiovascular Exercise Tolerance: Good hypertension, Pt. on medications Normal cardiovascular exam Rhythm:Regular Rate:Normal     Neuro/Psych negative neurological ROS  negative psych ROS   GI/Hepatic Neg liver ROS,GERD  Medicated,,  Endo/Other  negative endocrine ROS    Renal/GU Renal disease  negative genitourinary   Musculoskeletal negative musculoskeletal ROS (+)    Abdominal   Peds negative pediatric ROS (+)  Hematology negative hematology ROS (+)   Anesthesia Other Findings   Reproductive/Obstetrics negative OB ROS                             Anesthesia Physical Anesthesia Plan  ASA: 3  Anesthesia Plan: General   Post-op Pain Management: Celebrex PO (pre-op)* and Tylenol PO (pre-op)*   Induction: Intravenous  PONV Risk Score and Plan: 2 and Ondansetron, Dexamethasone, Treatment may vary due to age or medical condition and TIVA  Airway Management Planned: LMA  Additional Equipment: None  Intra-op Plan:   Post-operative Plan: Extubation in OR  Informed Consent: I have reviewed the patients History and Physical, chart, labs and discussed the procedure including the risks, benefits and alternatives for the proposed anesthesia with the patient or authorized representative who has indicated his/her understanding and acceptance.     Dental advisory given  Plan Discussed with: CRNA and  Surgeon  Anesthesia Plan Comments: (  )        Anesthesia Quick Evaluation

## 2022-11-18 NOTE — Anesthesia Procedure Notes (Signed)
Procedure Name: Intubation Date/Time: 11/18/2022 1:19 PM  Performed by: Bishop Limbo, CRNAPre-anesthesia Checklist: Patient identified, Emergency Drugs available, Suction available and Patient being monitored Patient Re-evaluated:Patient Re-evaluated prior to induction Oxygen Delivery Method: Circle System Utilized Preoxygenation: Pre-oxygenation with 100% oxygen Induction Type: IV induction Ventilation: Mask ventilation without difficulty Laryngoscope Size: Mac and 4 Grade View: Grade I Tube type: Oral Tube size: 7.5 mm Number of attempts: 1 Airway Equipment and Method: Stylet and Bite block Placement Confirmation: ETT inserted through vocal cords under direct vision, positive ETCO2 and breath sounds checked- equal and bilateral Secured at: 22 cm Tube secured with: Tape Dental Injury: Teeth and Oropharynx as per pre-operative assessment

## 2022-11-18 NOTE — H&P (Signed)
Office Visit Report     11/04/2022   --------------------------------------------------------------------------------   Timothy Rice  MRN: [id number]  DOB: [birth year], 72 year old Male  SSN:    PRIMARY CARE:  Mike Gip  PRIMARY CARE FAX:  [phone]  REFERRING:  Jannifer Hick, MD  PROVIDER:  Jettie Pagan, M.D.  TREATING:  Anne Fu, NP  LOCATION:  [facility] [phone]     --------------------------------------------------------------------------------   CC/HPI: Timothy Rice is a 72 year old male who is seen in consultation for localized prostate cancer.   1. Localized low risk prostate cancer:  He was found to have an elevated PSA of 8.8 in 08/2021 and 9.2 in 07/2022. He underwent evaluation with Dr. Merry Lofty and had TRUS biopsy on 08/06/2022 that revealed 1/12 cores positive involving 10% of the core with GS 3+3 = 6 prostate cancer at the left base. TRUS volume 47 cc.   Family history: Denies   He has already met with Dr. Kathrynn Running and would like to proceed with brachytherapy. He states he has a lot of anxiety surrounding prostate cancer would like to have this treated rather than surveill.   IPSS score: 9, QOL left 1.  SHIM: 15   Patient currently denies fever, chills, sweats, nausea, vomiting, abdominal or flank pain, gross hematuria or dysuria.   He has a past medical history of asthma, bilateral carotid artery stenosis, chronic renal impairment, erectile dysfunction, GERD, HLD, HTN, IBS, OSA, prediabetes.   11/04/2022: Patient is here today for preoperative appointment prior to undergoing radioactive seed implant and SpaceOAR insertion on 6/3 with Dr. Cardell Peach. He is here with his daughter. He denies any changes in past medical history, prescription medications taken on daily basis, no interval surgical or procedural intervention. He remains on tamsulosin for management of underlying BPH symptoms endorsing stable, grossly nonbothersome  symptomology with the addition of the alpha-blocker therapy. He said no recent dysuria, gross hematuria, no interval treatment for UTI. He denies any recent fevers or chills, nausea/vomiting, chest pain or shortness of breath.     ALLERGIES: None   MEDICATIONS: Omeprazole  Atorvastatin Calcium  Flomax  Meclizine Hcl  Repatha Sureclick  Telmisartan     GU PSH: No GU PSH    NON-GU PSH: No Non-GU PSH    GU PMH: Prostate Cancer - 09/18/2022    NON-GU PMH: No Non-GU PMH    FAMILY HISTORY: 2 daughters - Runs in Family   SOCIAL HISTORY: Marital Status: Married Preferred Language: English; Ethnicity: ; Race: White, Asian Current Smoking Status: Patient has never smoked.   Tobacco Use Assessment Completed: Used Tobacco in last 30 days? Has never drank.  Drinks 1 caffeinated drink per day.    REVIEW OF SYSTEMS:    GU Review Male:   Patient reports stream starts and stops. Patient denies frequent urination, hard to postpone urination, burning/ pain with urination, get up at night to urinate, leakage of urine, trouble starting your stream, have to strain to urinate , erection problems, and penile pain.  Gastrointestinal (Upper):   Patient denies nausea, vomiting, and indigestion/ heartburn.  Gastrointestinal (Lower):   Patient denies diarrhea and constipation.  Constitutional:   Patient denies night sweats, weight loss, fatigue, and fever.  Skin:   Patient denies skin rash/ lesion and itching.  Eyes:   Patient denies blurred vision and double vision.  Ears/ Nose/ Throat:   Patient denies sore throat and sinus problems.  Hematologic/Lymphatic:   Patient denies swollen glands and easy bruising.  Cardiovascular:  Patient denies leg swelling and chest pains.  Respiratory:   Patient denies cough and shortness of breath.  Endocrine:   Patient denies excessive thirst.  Musculoskeletal:   Patient denies back pain and joint pain.  Neurological:   Patient denies headaches and dizziness.   Psychologic:   Patient denies depression and anxiety.   Notes: Reviewed previous review of systems 09/18/2022. No changes.   VITAL SIGNS:      11/04/2022 01:01 PM  BP 108/72 mmHg  Pulse 90 /min  Temperature 98.2 F / 36.7 C   MULTI-SYSTEM PHYSICAL EXAMINATION:    Constitutional: Well-nourished. No physical deformities. Normally developed. Good grooming.  Neck: Neck symmetrical, not swollen. Normal tracheal position.  Respiratory: No labored breathing, no use of accessory muscles.   Cardiovascular: Normal temperature, normal extremity pulses, no swelling, no varicosities.  Skin: No paleness, no jaundice, no cyanosis. No lesion, no ulcer, no rash.  Neurologic / Psychiatric: Oriented to time, oriented to place, oriented to person. No depression, no anxiety, no agitation.  Gastrointestinal: No mass, no tenderness, no rigidity, non obese abdomen.  Musculoskeletal: Normal gait and station of head and neck.     Complexity of Data:  Source Of History:  Patient, Family/Caregiver, Medical Record Summary  Lab Test Review:   PSA  Records Review:   Pathology Reports, Previous Doctor Records, Previous Hospital Records, Previous Patient Records  Urine Test Review:   Urinalysis   11/04/22  Urinalysis  Urine Appearance Clear   Urine Color Yellow   Urine Glucose Neg mg/dL  Urine Bilirubin Neg mg/dL  Urine Ketones Neg mg/dL  Urine Specific Gravity 1.020   Urine Blood Neg ery/uL  Urine pH 7.0   Urine Protein Neg mg/dL  Urine Urobilinogen 0.2 mg/dL  Urine Nitrites Neg   Urine Leukocyte Esterase Neg leu/uL   PROCEDURES:          Visit Complexity - G2211          Urinalysis Dipstick Dipstick Cont'd  Color: Yellow Bilirubin: Neg mg/dL  Appearance: Clear Ketones: Neg mg/dL  Specific Gravity: 1.610 Blood: Neg ery/uL  pH: 7.0 Protein: Neg mg/dL  Glucose: Neg mg/dL Urobilinogen: 0.2 mg/dL    Nitrites: Neg    Leukocyte Esterase: Neg leu/uL    ASSESSMENT:      ICD-10 Details  1 GU:    Prostate Cancer - C61 Chronic, Threat to Bodily Function  2 NON-GU:   Encounter for other preprocedural examination - Z01.818 Undiagnosed New Problem   PLAN:           Orders Labs Urine Culture          Schedule Return Visit/Planned Activity: Keep Scheduled Appointment - Follow up MD, Schedule Surgery          Document Letter(s):  Created for Patient: Clinical Summary         Notes:   All questions answered to the best my ability regarding the upcoming procedure and expected postoperative course with understanding expressed by the patient. Urine culture sent today to serve as precautionary baseline. He will proceed with previously scheduled brachytherapy and SpaceOAR insertion on 6/3.        Next Appointment:      Next Appointment: 11/18/2022 01:45 PM    Appointment Type: Surgery     Location: [facility] [phone]    Provider: Jettie Pagan, M.D.    Reason for Visit: NE/OP BRACHYTHERAPY, SPACE OAR, CYSTO, TRUS      Signed by Anne Fu, NP on  11/04/22 at 1:22 PM (EDT)  Urology Preoperative H&P   Chief Complaint: Prostate cancer  History of Present Illness: Timothy Rice is a 72 y.o. male with prostate cancer is here for brachytherapy. Denies fevers, chills, dysuria.    Past Medical History:  Diagnosis Date   Abdominal wall hernia    Arthropathy    Asthma    Bladder outflow obstruction    Chronic kidney disease Stage 3 A    Closed right ankle fracture 2016   Complication of anesthesia    slow to awaken   ED (erectile dysfunction)    Elevated PSA    Fatigue    GERD (gastroesophageal reflux disease)    HLD (hyperlipidemia)    Hypercholesteremia    Hypercholesterolemia    Hyperlipidemia    Hypertension    Hypertriglyceridemia    Lower urinary tract symptoms (LUTS)    Meniere disease    Microscopic hematuria    OSA (obstructive sleep apnea)    did not tolerate cpap   Palpitations    Prostate Cancer (HCC)    Vitamin D deficiency      Past Surgical History:  Procedure Laterality Date   COLONOSCOPY     FLEXIBLE BRONCHOSCOPY W/ UPPER ENDOSCOPY     PROSTATE BIOPSY      Allergies: No Known Allergies  Family History  Problem Relation Age of Onset   Hypertension Mother    Heart attack Mother    Coronary artery disease Mother    Stroke Mother    Stroke Father    Coronary artery disease Father    Heart attack Father    Hypertension Sister    Hyperlipidemia Sister    Hyperlipidemia Brother    Hypertension Brother     Social History:  reports that he has quit smoking. His smoking use included cigarettes. He has never used smokeless tobacco. He reports current alcohol use. He reports that he does not use drugs.  ROS: A complete review of systems was performed.  All systems are negative except for pertinent findings as noted.  Physical Exam:  Vital signs in last 24 hours: Temp:  [97.5 F (36.4 C)] 97.5 F (36.4 C) (06/03 1146) Pulse Rate:  [73] 73 (06/03 1146) Resp:  [16] 16 (06/03 1146) BP: (115)/(83) 115/83 (06/03 1146) SpO2:  [99 %] 99 % (06/03 1146) Weight:  [73 kg] 73 kg (06/03 1146) Constitutional:  Alert and oriented, No acute distress Cardiovascular: Regular rate and rhythm Respiratory: Normal respiratory effort, Lungs clear bilaterally GI: Abdomen is soft, nontender, nondistended, no abdominal masses GU: No CVA tenderness Lymphatic: No lymphadenopathy Neurologic: Grossly intact, no focal deficits Psychiatric: Normal mood and affect  Laboratory Data:  No results for input(s): "WBC", "HGB", "HCT", "PLT" in the last 72 hours.  No results for input(s): "NA", "K", "CL", "GLUCOSE", "BUN", "CALCIUM", "CREATININE" in the last 72 hours.  Invalid input(s): "CO3"   No results found for this or any previous visit (from the past 24 hour(s)). No results found for this or any previous visit (from the past 240 hour(s)).  Renal Function: No results for input(s): "CREATININE" in the last 168  hours. CrCl cannot be calculated (Patient's most recent lab result is older than the maximum 21 days allowed.).  Radiologic Imaging: No results found.  I independently reviewed the above imaging studies.  Assessment and Plan Timothy Rice is a 72 y.o. male with prostate cancer here for brachytherapy.    Brachytherapy/space OAR consent- The patient was counseled about the natural history of  prostate cancer and the standard treatment options that are available for prostate cancer. It was explained to him how his age and life expectancy, clinical stage, Gleason score, and PSA affect his prognosis, the decision to proceed with additional staging studies, as well as how that information influences recommended treatment strategies. We discussed the roles for active surveillance, radiation therapy, surgical therapy, androgen deprivation, as well as ablative therapy options for the treatment of prostate cancer as appropriate to his individual cancer situation. We discussed the risks and benefits of these options with regard to their impact on cancer control and also in terms of potential adverse events, complications, and impact on quality of life particularly related to urinary and sexual function. The patient was encouraged to ask questions throughout the discussion today and all questions were answered to his stated satisfaction. In addition, the patient was provided with and/or directed to appropriate resources and literature for further education about prostate cancer and treatment options.   The patient has decided to proceed with brachytherapy and SpaceOAR placement as primary treatment of his intermediate risk prostate cancer.  The risks, benefits and alternatives of the aforementioned procedures was discussed in detail.  Risks include, bur are not limited to worsening LUTS, erectile dysfunction, rectal irritation, urethral stricture formation, fistula formation, cancer recurrence, MI, CVA, PE, DVT  and the inherent risk of general anesthesia.  He voices understanding and wishes to proceed.     Timothy R. Jodette Wik MD 11/18/2022, 12:29 PM  [facility] Pager: [phone]): [phone]

## 2022-11-18 NOTE — Discharge Instructions (Addendum)
Activity:  You are encouraged to ambulate frequently (about every hour during waking hours) to help prevent blood clots from forming in your legs or lungs.    Diet: You should advance your diet as instructed by your physician.  It will be normal to have some bloating, nausea, and abdominal discomfort intermittently.  Prescriptions:  You will be provided a prescription for pain medication to take as needed.  If your pain is not severe enough to require the prescription pain medication, you may take extra strength Tylenol instead which will have less side effects.  You should also take a prescribed stool softener to avoid straining with bowel movements as the prescription pain medication may constipate you.  What to call us about: You should call the office [phone]) if you develop fever > 101 or develop persistent vomiting. Activity:  You are encouraged to ambulate frequently (about every hour during waking hours) to help prevent blood clots from forming in your legs or lungs.          No acetaminophen/Tylenol until after 5:30 pm today if needed.  No ibuprofen, Advil, Aleve, Motrin, ketorolac, meloxicam, naproxen, or other NSAIDS until after 5:30 pm today if needed.

## 2022-11-18 NOTE — Op Note (Signed)
PATIENT:  Timothy Rice  PRE-OPERATIVE DIAGNOSIS:  Adenocarcinoma of the prostate  POST-OPERATIVE DIAGNOSIS:  Same  PROCEDURE:  1. I-125 radioactive seed implantation 2. Cystoscopy  3. Placement of SpaceOAR  SURGEON:  Jettie Pagan, MD  Radiation oncologist: Margaretmary Dys, MD  ANESTHESIA:  General  EBL:  Minimal  DRAINS: None  INDICATION: Timothy Rice  Description of procedure: After informed consent the patient was brought to the major OR, placed on the table and administered general anesthesia. He was then moved to the modified lithotomy position with his perineum perpendicular to the floor. His perineum and genitalia were then sterilely prepped. An official timeout was then performed. A 16 French Foley catheter was then placed in the bladder and filled with dilute contrast, a rectal tube was placed in the rectum and the transrectal ultrasound probe was placed in the rectum and affixed to the stand. He was then sterilely draped.  Real time ultrasonography was used along with the seed planning software. This was used to develop the seed plan including the number of needles as well as number of seeds required for complete and adequate coverage. Real-time ultrasonography was then used along with the previously developed plan and the Nucletron device to implant a total of 75 seeds using 18 needles. This proceeded without difficulty or complication.   I then proceeded with placement of SpaceOAR by introducing a needle with the bevel angled inferiorly approximately 2 cm superior to the anus. This was angled downward and under direct ultrasound was placed within the space between the prostatic capsule and rectum. This was confirmed with a small amount of sterile saline injected and this was performed under direct ultrasound. I then attached the SpaceOAR to the needle and injected this in the space between the prostate and rectum with good placement noted.  A Foley catheter was then  removed as well as the transrectal ultrasound probe and rectal probe. Flexible cystoscopy was then performed using the 16 French flexible scope which revealed a normal urethra throughout its length down to the sphincter which appeared intact. The prostatic urethra revealed bilobar hypertrophy but no evidence of obstruction, seeds, spacers or lesions. The bladder was then entered and fully and systematically inspected. The ureteral orifices were noted to be of normal configuration and position. The mucosa revealed no evidence of tumors. There were also no stones identified within the bladder. I noted no seeds or spacers on the floor of the bladder and retroflexion of the scope revealed no seeds protruding from the base of the prostate.  The cystoscope was then removed and the patient was awakened and taken to recovery room in stable and satisfactory condition. He tolerated procedure well and there were no intraoperative complications.   Matt R. Mary Secord MD [facility]  Pager: [phone]

## 2022-11-19 ENCOUNTER — Encounter (HOSPITAL_BASED_OUTPATIENT_CLINIC_OR_DEPARTMENT_OTHER): Payer: Self-pay | Admitting: Urology

## 2022-11-19 LAB — POCT I-STAT, CHEM 8
BUN: 19 mg/dL (ref 8–23)
Calcium, Ion: 1.25 mmol/L (ref 1.15–1.40)
Chloride: 107 mmol/L (ref 98–111)
Creatinine, Ser: 1.3 mg/dL — ABNORMAL HIGH (ref 0.61–1.24)
Glucose, Bld: 104 mg/dL — ABNORMAL HIGH (ref 70–99)
HCT: 43 % (ref 39.0–52.0)
Hemoglobin: 14.6 g/dL (ref 13.0–17.0)
Potassium: 5.5 mmol/L — ABNORMAL HIGH (ref 3.5–5.1)
Sodium: 141 mmol/L (ref 135–145)
TCO2: 29 mmol/L (ref 22–32)

## 2022-11-21 NOTE — Progress Notes (Signed)
Patient was a RadOnc Consult on 09/04/22 for his stage T1c adenocarcinoma of the prostate with Gleason score of 3+3, and PSA of 6.1. Patient proceed with treatment recommendations of brachytherapy and had his procedure on 11/18/22.   Patient is scheduled for his CT Sim post seed on 12/11/22 and has his post op apt on 12/02/22 at [facility].

## 2022-11-28 ENCOUNTER — Ambulatory Visit (HOSPITAL_COMMUNITY): Payer: 59

## 2022-12-10 ENCOUNTER — Telehealth: Payer: Self-pay | Admitting: *Deleted

## 2022-12-10 NOTE — Telephone Encounter (Signed)
THE INTERPRETER JULIE PHONE THIS PATIENT TO REMIND OF POST SEED APPTS. FOR 12-11-22- ARRIVAL TIME- 10:15 AM, JULIE STATED THAT SHE WOULD DO THIS

## 2022-12-10 NOTE — Telephone Encounter (Signed)
XXXX 

## 2022-12-11 ENCOUNTER — Ambulatory Visit
Admission: RE | Admit: 2022-12-11 | Discharge: 2022-12-11 | Disposition: A | Discharge status: Home / Self Care | Payer: 59 | Source: Ambulatory Visit | Attending: Urology | Admitting: Urology

## 2022-12-11 ENCOUNTER — Ambulatory Visit
Admission: RE | Admit: 2022-12-11 | Discharge: 2022-12-11 | Disposition: A | Discharge status: Home / Self Care | Payer: 59 | Source: Ambulatory Visit | Attending: Radiation Oncology | Admitting: Radiation Oncology

## 2022-12-11 ENCOUNTER — Encounter: Payer: Self-pay | Admitting: Urology

## 2022-12-11 ENCOUNTER — Other Ambulatory Visit: Payer: Self-pay

## 2022-12-11 VITALS — BP 119/83 | HR 75 | Temp 97.7°F | Resp 18 | Ht 64.0 in | Wt 163.0 lb

## 2022-12-11 DIAGNOSIS — C61 Malignant neoplasm of prostate: Secondary | ICD-10-CM | POA: Insufficient documentation

## 2022-12-11 DIAGNOSIS — Z923 Personal history of irradiation: Secondary | ICD-10-CM | POA: Insufficient documentation

## 2022-12-11 DIAGNOSIS — Z79899 Other long term (current) drug therapy: Secondary | ICD-10-CM | POA: Insufficient documentation

## 2022-12-11 NOTE — Progress Notes (Signed)
Radiation Oncology         [phone]) [phone] ________________________________  Name: Avante Carneiro MRN: [id number]  Date: 12/11/2022  DOB: [birth year]  Post-Seed Follow-Up Visit Note  CC: Mike Gip, FNP (Inactive)  Puschinsky, Adelfa Koh.,*  Diagnosis:   72 y.o. gentleman with Stage T1c adenocarcinoma of the prostate with Gleason score of 3+3, and PSA of 6.1.     ICD-10-CM   1. Malignant neoplasm of prostate (HCC)  C61       Interval Since Last Radiation:  3 weeks 11/18/22:  Insertion of radioactive I-125 seeds into the prostate gland; 145 Gy, definitive therapy with placement of SpaceOAR gel.  Narrative:  The patient returns today for routine follow-up.  He is complaining of increased urinary frequency and urinary hesitation symptoms. He filled out a questionnaire regarding urinary function today providing and overall IPSS score of 21 characterizing his symptoms as moderate-severe.  His pre-implant score was 11, on Flomax daily. He has continued taking Flomax daily and reports intermittency, hesitancy, increased urinary urgency and feelings of incomplete bladder emptying but feels that these are gradually improving. He specifically denies dysuria, gross hematuria, incontinence, fever or chills. He denies any abdominal pain or bowel symptoms. His energy level has not significantly changed and overall, he is quite pleased with his progress to date.  ALLERGIES:  has No Known Allergies.  Meds: Current Outpatient Medications  Medication Sig Dispense Refill   albuterol (VENTOLIN HFA) 108 (90 Base) MCG/ACT inhaler Inhale into the lungs as needed for wheezing or shortness of breath. (Patient not taking: Reported on 11/13/2022)     atorvastatin (LIPITOR) 40 MG tablet Take 40 mg by mouth daily.     docusate sodium (COLACE) 100 MG capsule Take 1 capsule (100 mg total) by mouth daily as needed for up to 30 doses. 30 capsule 0   hydrochlorothiazide (HYDRODIURIL) 25 MG tablet TAKE ONE TABLET o EVERY  DAY     meclizine (ANTIVERT) 25 MG tablet TAKE ONE TABLET BY MOUTH THREE TIMES DAILY AS NEEDED 30 tablet 1   omeprazole (PRILOSEC) 40 MG capsule      oxyCODONE-acetaminophen (PERCOCET) 5-325 MG tablet Take 1 tablet by mouth every 4 (four) hours as needed for up to 20 doses for severe pain. 20 tablet 0   REPATHA SURECLICK 140 MG/ML SOAJ Inject 1 mL into the skin every 14 (fourteen) days.     tamsulosin (FLOMAX) 0.4 MG CAPS capsule Take 0.4 mg by mouth daily at 2 PM.     telmisartan (MICARDIS) 40 MG tablet Take 40 mg by mouth daily.     triamcinolone cream (KENALOG) 0.1 % APPLY A THIN LAYER TO THE AFFECTED AREA(S) BY TOPICAL ROUTE 2 TIMES PER DAY     No current facility-administered medications for this visit.    Physical Findings: In general this is a well appearing Latino male in no acute distress. He's alert and oriented x4 and appropriate throughout the examination. Cardiopulmonary assessment is negative for acute distress and he exhibits normal effort.   Lab Findings: Lab Results  Component Value Date   WBC 4.0 04/08/2016   HGB 14.6 11/18/2022   HCT 43.0 11/18/2022   MCV 90.0 04/08/2016   PLT 218 04/08/2016    Radiographic Findings:  Patient underwent CT imaging in our clinic for post implant dosimetry. The CT will be reviewed by Dr. Kathrynn Running to confirm there is an adequate distribution of radioactive seeds throughout the prostate gland and ensure that there are no seeds in or near the  rectum.  We suspect the final radiation plan and dosimetry will show appropriate coverage of the prostate gland. He understands that we will call and inform him of any unexpected findings on further review of his imaging and dosimetry.  Impression/Plan: 72 y.o. gentleman with Stage T1c adenocarcinoma of the prostate with Gleason score of 3+3, and PSA of 6.1.  The patient is recovering from the effects of radiation. His urinary symptoms should gradually improve over the next 4-6 months. We talked about  this today. He is encouraged by his improvement already and is otherwise pleased with his outcome. We also talked about long-term follow-up for prostate cancer following seed implant. He understands that ongoing PSA determinations and digital rectal exams will help perform surveillance to rule out disease recurrence. He had a post-procedure follow up with Anne Fu on 12/02/22 and is scheduled for a follow up visit with Dr. Cardell Peach on 04/08/23 with his first post-treatment PSA to be performed prior to that visit. He understands what to expect with his PSA measures. Patient was also educated today about some of the long-term effects from radiation including a small risk for rectal bleeding and possibly erectile dysfunction. We talked about some of the general management approaches to these potential complications. However, I did encourage the patient to contact our office or return at any point if he has questions or concerns related to his previous radiation and prostate cancer.    Marguarite Arbour, PA-C

## 2022-12-11 NOTE — Progress Notes (Signed)
  Radiation Oncology         [phone]) [phone] ________________________________  Name: Timothy Rice MRN: [id number]  Date: 12/11/2022  DOB: 1950-09-30  COMPLEX SIMULATION NOTE  NARRATIVE:  The patient was brought to the CT Simulation planning suite today following prostate seed implantation approximately one month ago.  Identity was confirmed.  All relevant records and images related to the planned course of therapy were reviewed.  Then, the patient was set-up supine.  CT images were obtained.  The CT images were loaded into the planning software.  Then the prostate and rectum were contoured.  Treatment planning then occurred.  The implanted iodine 125 seeds were identified by the physics staff for projection of radiation distribution  I have requested : 3D Simulation  I have requested a DVH of the following structures: Prostate and rectum.    ________________________________  Artist Pais Kathrynn Running, M.D.

## 2022-12-11 NOTE — Progress Notes (Addendum)
Post-seed nursing interview for a diagnosis of Stage T1c adenocarcinoma of the prostate with Gleason score of 3+3, and PSA of 6.1.  Patient identity verified x2.   Patient reports dysuria 5/10. No other issues conveyed at this time.  Meaningful use complete.  I-PSS score- 21 - Severe SHIM score- 22 Urinary Management medication(s) Tamsulosin Urology appointment date- 04/2023 with Dr. Cardell Peach at [facility]  Vitals- BP 119/83 (BP Location: Left Arm, Patient Position: Sitting, Cuff Size: Normal)   Pulse 75   Temp 97.7 F (36.5 C) (Temporal)   Resp 18   Ht 5\' 4"  (1.626 m)   Wt 163 lb (73.9 kg)   SpO2 100%   BMI 27.98 kg/m   This concludes the interaction.  Ruel Favors, LPN

## 2022-12-16 ENCOUNTER — Ambulatory Visit
Admission: RE | Admit: 2022-12-16 | Discharge: 2022-12-16 | Disposition: A | Discharge status: Home / Self Care | Payer: 59 | Source: Ambulatory Visit | Attending: Radiation Oncology | Admitting: Radiation Oncology

## 2022-12-16 ENCOUNTER — Encounter: Payer: Self-pay | Admitting: Radiation Oncology

## 2022-12-16 DIAGNOSIS — C61 Malignant neoplasm of prostate: Secondary | ICD-10-CM | POA: Diagnosis present

## 2022-12-16 NOTE — Progress Notes (Signed)
  Radiation Oncology         [phone]) [phone] ________________________________  Name: Timothy Rice MRN: [id number]  Date: 12/16/2022  DOB: 1950-12-06  3D Planning Note   Prostate Brachytherapy Post-Implant Dosimetry  Diagnosis: 72 y.o. gentleman with Stage T1c adenocarcinoma of the prostate with Gleason Score of 3+3, and PSA of 6.1.   Narrative: On a previous date, Timothy Rice returned following prostate seed implantation for post implant planning. He underwent CT scan complex simulation to delineate the three-dimensional structures of the pelvis and demonstrate the radiation distribution.  Since that time, the seed localization, and complex isodose planning with dose volume histograms have now been completed.  Results:   Prostate Coverage - The dose of radiation delivered to the 90% or more of the prostate gland (D90) was 128.36% of the prescription dose. This exceeds our goal of greater than 90%. Rectal Sparing - The volume of rectal tissue receiving the prescription dose or higher was 0.0 cc. This falls under our thresholds tolerance of 1.0 cc.  Impression: The prostate seed implant appears to show adequate target coverage and appropriate rectal sparing.  Plan:  The patient will continue to follow with urology for ongoing PSA determinations. I would anticipate a high likelihood for local tumor control with minimal risk for rectal morbidity.  ________________________________  Artist Pais Kathrynn Running, M.D.

## 2022-12-16 NOTE — Radiation Completion Notes (Signed)
Patient Name: Timothy Rice, Timothy Rice MRN: [id number] Date of Birth: 12-24-1950 Referring Physician: Isac Caddy, M.D. Date of Service: 2022-12-16 Radiation Oncologist: Margaretmary Bayley, M.D. [facility] [place]                             RADIATION ONCOLOGY END OF TREATMENT NOTE     Diagnosis: C61 Malignant neoplasm of prostate Staging on 2022-08-06: Malignant neoplasm of prostate (HCC) T=cT1c, N=cN0, M=cM0 Intent: Curative     ==========DELIVERED PLANS==========  Prostate Seed Implant Date: 2022-11-18   Plan Name: Prostate Seed Implant Site: Prostate Technique: Radioactive Seed Implant I-125 Mode: Brachytherapy Dose Per Fraction: 145 Gy Prescribed Dose (Delivered / Prescribed): 145 Gy / 145 Gy Prescribed Fxs (Delivered / Prescribed): 1 / 1     ==========ON TREATMENT VISIT DATES========== 2022-11-18     ==========UPCOMING VISITS==========

## 2023-02-03 ENCOUNTER — Encounter: Payer: Self-pay | Admitting: *Deleted

## 2023-02-03 ENCOUNTER — Inpatient Hospital Stay: Payer: 59 | Attending: Adult Health | Admitting: *Deleted

## 2023-02-03 DIAGNOSIS — C61 Malignant neoplasm of prostate: Secondary | ICD-10-CM

## 2023-02-03 NOTE — Progress Notes (Signed)
SCP reviewed and completed. Pt's daughter, Jana Half was on the telephone visit and interpreted for dad. I will call [facility] to find out when post treatment PSA labs are and inform pt of that information.

## 2023-02-03 NOTE — Progress Notes (Signed)
Left voice message to inform pt of 4 mo follow-up appt with Dr. Cardell Peach on Oct.22 at 12:30.
